# Patient Record
Sex: Female | Born: 1980 | Race: White | Hispanic: Yes | Marital: Married | Smoking: Never smoker
Health system: Southern US, Community
[De-identification: ages and names within clinical notes are randomized; demographics above are authoritative.]

## PROBLEM LIST (undated history)

## (undated) DIAGNOSIS — K219 Gastro-esophageal reflux disease without esophagitis: Secondary | ICD-10-CM

## (undated) DIAGNOSIS — R519 Headache, unspecified: Secondary | ICD-10-CM

## (undated) DIAGNOSIS — R112 Nausea with vomiting, unspecified: Secondary | ICD-10-CM

## (undated) DIAGNOSIS — Z9889 Other specified postprocedural states: Secondary | ICD-10-CM

## (undated) DIAGNOSIS — F32A Depression, unspecified: Secondary | ICD-10-CM

## (undated) DIAGNOSIS — F419 Anxiety disorder, unspecified: Secondary | ICD-10-CM

## (undated) DIAGNOSIS — K8020 Calculus of gallbladder without cholecystitis without obstruction: Secondary | ICD-10-CM

## (undated) DIAGNOSIS — F329 Major depressive disorder, single episode, unspecified: Secondary | ICD-10-CM

## (undated) HISTORY — PX: UTERINE FIBROID SURGERY: SHX826

## (undated) HISTORY — DX: Anxiety disorder, unspecified: F41.9

---
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
## 2000-01-15 ENCOUNTER — Encounter: Admission: RE | Admit: 2000-01-15 | Discharge: 2000-01-15 | Payer: Self-pay | Admitting: Internal Medicine

## 2000-03-21 ENCOUNTER — Ambulatory Visit (HOSPITAL_COMMUNITY): Admission: RE | Admit: 2000-03-21 | Discharge: 2000-03-21 | Payer: Self-pay | Admitting: *Deleted

## 2000-07-18 ENCOUNTER — Inpatient Hospital Stay (HOSPITAL_COMMUNITY): Admission: AD | Admit: 2000-07-18 | Discharge: 2000-07-22 | Payer: Self-pay | Admitting: *Deleted

## 2002-08-10 ENCOUNTER — Encounter: Payer: Self-pay | Admitting: Emergency Medicine

## 2002-08-10 ENCOUNTER — Emergency Department (HOSPITAL_COMMUNITY): Admission: EM | Admit: 2002-08-10 | Discharge: 2002-08-10 | Payer: Self-pay | Admitting: *Deleted

## 2002-08-28 ENCOUNTER — Encounter: Admission: RE | Admit: 2002-08-28 | Discharge: 2002-08-28 | Payer: Self-pay | Admitting: Internal Medicine

## 2002-09-11 ENCOUNTER — Emergency Department (HOSPITAL_COMMUNITY): Admission: EM | Admit: 2002-09-11 | Discharge: 2002-09-11 | Payer: Self-pay | Admitting: Emergency Medicine

## 2002-09-23 ENCOUNTER — Encounter: Admission: RE | Admit: 2002-09-23 | Discharge: 2002-09-23 | Payer: Self-pay | Admitting: Internal Medicine

## 2002-10-14 ENCOUNTER — Encounter: Admission: RE | Admit: 2002-10-14 | Discharge: 2002-10-14 | Payer: Self-pay | Admitting: Internal Medicine

## 2002-12-16 ENCOUNTER — Encounter: Admission: RE | Admit: 2002-12-16 | Discharge: 2002-12-16 | Payer: Self-pay | Admitting: Internal Medicine

## 2003-01-25 ENCOUNTER — Encounter: Payer: Self-pay | Admitting: Internal Medicine

## 2003-01-25 ENCOUNTER — Encounter: Admission: RE | Admit: 2003-01-25 | Discharge: 2003-01-25 | Payer: Self-pay | Admitting: Internal Medicine

## 2003-02-12 ENCOUNTER — Encounter: Admission: RE | Admit: 2003-02-12 | Discharge: 2003-02-12 | Payer: Self-pay | Admitting: Internal Medicine

## 2003-11-26 ENCOUNTER — Encounter: Admission: RE | Admit: 2003-11-26 | Discharge: 2003-11-26 | Payer: Self-pay | Admitting: Internal Medicine

## 2004-03-17 ENCOUNTER — Encounter: Admission: RE | Admit: 2004-03-17 | Discharge: 2004-03-17 | Payer: Self-pay | Admitting: Internal Medicine

## 2004-12-05 ENCOUNTER — Ambulatory Visit: Payer: Self-pay | Admitting: Internal Medicine

## 2004-12-12 ENCOUNTER — Ambulatory Visit: Payer: Self-pay | Admitting: Internal Medicine

## 2004-12-12 ENCOUNTER — Encounter (INDEPENDENT_AMBULATORY_CARE_PROVIDER_SITE_OTHER): Payer: Self-pay | Admitting: Pulmonary Disease

## 2005-07-15 ENCOUNTER — Inpatient Hospital Stay (HOSPITAL_COMMUNITY): Admission: AD | Admit: 2005-07-15 | Discharge: 2005-07-16 | Payer: Self-pay | Admitting: Obstetrics & Gynecology

## 2005-07-18 ENCOUNTER — Inpatient Hospital Stay (HOSPITAL_COMMUNITY): Admission: AD | Admit: 2005-07-18 | Discharge: 2005-07-18 | Payer: Self-pay | Admitting: *Deleted

## 2005-07-20 ENCOUNTER — Inpatient Hospital Stay (HOSPITAL_COMMUNITY): Admission: AD | Admit: 2005-07-20 | Discharge: 2005-07-20 | Payer: Self-pay | Admitting: *Deleted

## 2005-07-20 ENCOUNTER — Ambulatory Visit: Payer: Self-pay | Admitting: *Deleted

## 2005-07-22 ENCOUNTER — Inpatient Hospital Stay (HOSPITAL_COMMUNITY): Admission: AD | Admit: 2005-07-22 | Discharge: 2005-07-22 | Payer: Self-pay | Admitting: Family Medicine

## 2005-08-01 ENCOUNTER — Inpatient Hospital Stay (HOSPITAL_COMMUNITY): Admission: AD | Admit: 2005-08-01 | Discharge: 2005-08-01 | Payer: Self-pay | Admitting: Obstetrics & Gynecology

## 2005-08-21 ENCOUNTER — Ambulatory Visit: Payer: Self-pay | Admitting: Hospitalist

## 2005-11-06 ENCOUNTER — Ambulatory Visit: Payer: Self-pay | Admitting: Hospitalist

## 2005-11-08 ENCOUNTER — Ambulatory Visit (HOSPITAL_COMMUNITY): Admission: RE | Admit: 2005-11-08 | Discharge: 2005-11-08 | Payer: Self-pay | Admitting: Family Medicine

## 2005-11-12 ENCOUNTER — Ambulatory Visit: Payer: Self-pay | Admitting: Internal Medicine

## 2005-12-17 ENCOUNTER — Ambulatory Visit: Payer: Self-pay | Admitting: Family Medicine

## 2006-03-19 ENCOUNTER — Emergency Department (HOSPITAL_COMMUNITY): Admission: EM | Admit: 2006-03-19 | Discharge: 2006-03-19 | Payer: Self-pay | Admitting: Emergency Medicine

## 2006-04-29 ENCOUNTER — Encounter (INDEPENDENT_AMBULATORY_CARE_PROVIDER_SITE_OTHER): Payer: Self-pay | Admitting: *Deleted

## 2006-05-06 ENCOUNTER — Encounter: Payer: Self-pay | Admitting: Family Medicine

## 2006-05-06 ENCOUNTER — Ambulatory Visit: Payer: Self-pay | Admitting: Family Medicine

## 2006-05-07 ENCOUNTER — Ambulatory Visit: Payer: Self-pay | Admitting: Family Medicine

## 2006-05-08 ENCOUNTER — Ambulatory Visit: Payer: Self-pay | Admitting: Family Medicine

## 2006-05-10 ENCOUNTER — Emergency Department (HOSPITAL_COMMUNITY): Admission: EM | Admit: 2006-05-10 | Discharge: 2006-05-10 | Payer: Self-pay | Admitting: Family Medicine

## 2006-06-12 ENCOUNTER — Encounter (INDEPENDENT_AMBULATORY_CARE_PROVIDER_SITE_OTHER): Payer: Self-pay | Admitting: Pulmonary Disease

## 2006-06-12 DIAGNOSIS — F411 Generalized anxiety disorder: Secondary | ICD-10-CM | POA: Insufficient documentation

## 2006-08-26 ENCOUNTER — Encounter: Payer: Self-pay | Admitting: Family Medicine

## 2006-08-26 ENCOUNTER — Ambulatory Visit: Payer: Self-pay | Admitting: Family Medicine

## 2006-08-26 LAB — CONVERTED CEMR LAB
AST: 31 units/L (ref 0–37)
Alkaline Phosphatase: 66 units/L (ref 39–117)
HCV Ab: NEGATIVE
Total Protein: 6.9 g/dL (ref 6.0–8.3)

## 2006-09-27 ENCOUNTER — Encounter (INDEPENDENT_AMBULATORY_CARE_PROVIDER_SITE_OTHER): Payer: Self-pay | Admitting: *Deleted

## 2006-11-02 ENCOUNTER — Inpatient Hospital Stay (HOSPITAL_COMMUNITY): Admission: AD | Admit: 2006-11-02 | Discharge: 2006-11-02 | Payer: Self-pay | Admitting: Obstetrics and Gynecology

## 2006-11-07 ENCOUNTER — Emergency Department (HOSPITAL_COMMUNITY): Admission: EM | Admit: 2006-11-07 | Discharge: 2006-11-07 | Payer: Self-pay | Admitting: Emergency Medicine

## 2006-11-14 ENCOUNTER — Inpatient Hospital Stay (HOSPITAL_COMMUNITY): Admission: AD | Admit: 2006-11-14 | Discharge: 2006-11-14 | Payer: Self-pay | Admitting: Obstetrics and Gynecology

## 2006-11-19 ENCOUNTER — Encounter: Payer: Self-pay | Admitting: *Deleted

## 2006-11-20 ENCOUNTER — Encounter: Payer: Self-pay | Admitting: Family Medicine

## 2006-11-20 ENCOUNTER — Ambulatory Visit: Payer: Self-pay | Admitting: Sports Medicine

## 2006-11-20 ENCOUNTER — Telehealth: Payer: Self-pay | Admitting: *Deleted

## 2006-11-20 LAB — CONVERTED CEMR LAB
Blood in Urine, dipstick: NEGATIVE
Glucose, Urine, Semiquant: NEGATIVE
Protein, U semiquant: 100
WBC Urine, dipstick: NEGATIVE

## 2006-11-22 ENCOUNTER — Telehealth: Payer: Self-pay | Admitting: *Deleted

## 2006-11-23 LAB — CONVERTED CEMR LAB
ALT: 77 units/L — ABNORMAL HIGH (ref 0–35)
AST: 37 units/L (ref 0–37)
Albumin: 4.6 g/dL (ref 3.5–5.2)
Alkaline Phosphatase: 53 units/L (ref 39–117)
BUN: 7 mg/dL (ref 6–23)
Chloride: 102 meq/L (ref 96–112)
Creatinine, Ser: 0.54 mg/dL (ref 0.40–1.20)
Glucose, Bld: 89 mg/dL (ref 70–99)
Potassium: 4.3 meq/L (ref 3.5–5.3)
Sodium: 138 meq/L (ref 135–145)
TSH: 0.097 microintl units/mL — ABNORMAL LOW (ref 0.350–5.50)
Total Protein: 7.5 g/dL (ref 6.0–8.3)

## 2006-11-29 ENCOUNTER — Telehealth: Payer: Self-pay | Admitting: Family Medicine

## 2006-12-02 ENCOUNTER — Telehealth (INDEPENDENT_AMBULATORY_CARE_PROVIDER_SITE_OTHER): Payer: Self-pay | Admitting: *Deleted

## 2006-12-08 ENCOUNTER — Inpatient Hospital Stay (HOSPITAL_COMMUNITY): Admission: AD | Admit: 2006-12-08 | Discharge: 2006-12-08 | Payer: Self-pay | Admitting: Family Medicine

## 2006-12-17 ENCOUNTER — Encounter (INDEPENDENT_AMBULATORY_CARE_PROVIDER_SITE_OTHER): Payer: Self-pay | Admitting: Family Medicine

## 2006-12-17 ENCOUNTER — Ambulatory Visit: Payer: Self-pay | Admitting: Family Medicine

## 2006-12-18 LAB — CONVERTED CEMR LAB
Basophils Absolute: 0 10*3/uL (ref 0.0–0.1)
Basophils Relative: 0 % (ref 0–1)
Eosinophils Absolute: 0.1 10*3/uL (ref 0.0–0.7)
Eosinophils Relative: 1 % (ref 0–5)
Hepatitis B Surface Ag: NEGATIVE
MCHC: 33.1 g/dL (ref 30.0–36.0)
Neutrophils Relative %: 69 % (ref 43–77)
RBC: 4.29 M/uL (ref 3.87–5.11)
RDW: 13.6 % (ref 11.5–14.0)
Rh Type: POSITIVE
Rubella: 6.2 intl units/mL — ABNORMAL HIGH
WBC: 9.1 10*3/uL (ref 4.0–10.5)

## 2006-12-24 ENCOUNTER — Encounter (INDEPENDENT_AMBULATORY_CARE_PROVIDER_SITE_OTHER): Payer: Self-pay | Admitting: Family Medicine

## 2006-12-24 ENCOUNTER — Ambulatory Visit: Payer: Self-pay | Admitting: Sports Medicine

## 2006-12-24 ENCOUNTER — Inpatient Hospital Stay (HOSPITAL_COMMUNITY): Admission: AD | Admit: 2006-12-24 | Discharge: 2006-12-27 | Payer: Self-pay | Admitting: Obstetrics & Gynecology

## 2006-12-24 ENCOUNTER — Ambulatory Visit: Payer: Self-pay | Admitting: Physician Assistant

## 2007-01-09 ENCOUNTER — Ambulatory Visit: Payer: Self-pay | Admitting: Family Medicine

## 2007-01-09 ENCOUNTER — Encounter (INDEPENDENT_AMBULATORY_CARE_PROVIDER_SITE_OTHER): Payer: Self-pay | Admitting: Family Medicine

## 2007-01-13 ENCOUNTER — Inpatient Hospital Stay (HOSPITAL_COMMUNITY): Admission: AD | Admit: 2007-01-13 | Discharge: 2007-01-14 | Payer: Self-pay | Admitting: Family Medicine

## 2007-01-30 ENCOUNTER — Ambulatory Visit (HOSPITAL_COMMUNITY): Admission: RE | Admit: 2007-01-30 | Discharge: 2007-01-30 | Payer: Self-pay | Admitting: Dentistry

## 2007-02-03 ENCOUNTER — Ambulatory Visit: Payer: Self-pay | Admitting: Family Medicine

## 2007-02-06 ENCOUNTER — Encounter (INDEPENDENT_AMBULATORY_CARE_PROVIDER_SITE_OTHER): Payer: Self-pay | Admitting: Family Medicine

## 2007-02-06 ENCOUNTER — Ambulatory Visit: Payer: Self-pay | Admitting: Family Medicine

## 2007-02-06 DIAGNOSIS — Z8632 Personal history of gestational diabetes: Secondary | ICD-10-CM

## 2007-02-06 HISTORY — DX: Personal history of gestational diabetes: Z86.32

## 2007-02-12 ENCOUNTER — Encounter (INDEPENDENT_AMBULATORY_CARE_PROVIDER_SITE_OTHER): Payer: Self-pay | Admitting: Family Medicine

## 2007-02-14 ENCOUNTER — Ambulatory Visit: Payer: Self-pay | Admitting: Family Medicine

## 2007-02-14 ENCOUNTER — Encounter (INDEPENDENT_AMBULATORY_CARE_PROVIDER_SITE_OTHER): Payer: Self-pay | Admitting: Family Medicine

## 2007-02-14 LAB — CONVERTED CEMR LAB
HCV Ab: NEGATIVE
Hepatitis B Surface Ag: NEGATIVE

## 2007-02-17 ENCOUNTER — Encounter: Admission: RE | Admit: 2007-02-17 | Discharge: 2007-02-17 | Payer: Self-pay | Admitting: Obstetrics & Gynecology

## 2007-02-17 ENCOUNTER — Ambulatory Visit: Payer: Self-pay | Admitting: Family Medicine

## 2007-02-19 ENCOUNTER — Telehealth: Payer: Self-pay | Admitting: *Deleted

## 2007-02-24 ENCOUNTER — Ambulatory Visit: Payer: Self-pay | Admitting: Obstetrics & Gynecology

## 2007-02-26 ENCOUNTER — Inpatient Hospital Stay (HOSPITAL_COMMUNITY): Admission: AD | Admit: 2007-02-26 | Discharge: 2007-02-26 | Payer: Self-pay | Admitting: Gynecology

## 2007-02-26 ENCOUNTER — Ambulatory Visit: Payer: Self-pay | Admitting: Obstetrics & Gynecology

## 2007-02-26 ENCOUNTER — Telehealth (INDEPENDENT_AMBULATORY_CARE_PROVIDER_SITE_OTHER): Payer: Self-pay | Admitting: *Deleted

## 2007-02-26 ENCOUNTER — Ambulatory Visit: Payer: Self-pay | Admitting: Gynecology

## 2007-03-03 ENCOUNTER — Ambulatory Visit: Payer: Self-pay | Admitting: Obstetrics & Gynecology

## 2007-03-17 ENCOUNTER — Ambulatory Visit: Payer: Self-pay | Admitting: *Deleted

## 2007-03-24 ENCOUNTER — Ambulatory Visit: Payer: Self-pay | Admitting: Obstetrics & Gynecology

## 2007-03-26 ENCOUNTER — Ambulatory Visit (HOSPITAL_COMMUNITY): Admission: RE | Admit: 2007-03-26 | Discharge: 2007-03-26 | Payer: Self-pay | Admitting: Family Medicine

## 2007-03-26 ENCOUNTER — Encounter (INDEPENDENT_AMBULATORY_CARE_PROVIDER_SITE_OTHER): Payer: Self-pay | Admitting: Family Medicine

## 2007-04-07 ENCOUNTER — Ambulatory Visit: Payer: Self-pay | Admitting: Obstetrics & Gynecology

## 2007-04-21 ENCOUNTER — Ambulatory Visit: Payer: Self-pay | Admitting: Obstetrics & Gynecology

## 2007-04-28 ENCOUNTER — Encounter (INDEPENDENT_AMBULATORY_CARE_PROVIDER_SITE_OTHER): Payer: Self-pay | Admitting: Family Medicine

## 2007-05-05 ENCOUNTER — Ambulatory Visit: Payer: Self-pay | Admitting: Obstetrics & Gynecology

## 2007-05-05 ENCOUNTER — Ambulatory Visit (HOSPITAL_COMMUNITY): Admission: RE | Admit: 2007-05-05 | Discharge: 2007-05-05 | Payer: Self-pay | Admitting: Obstetrics and Gynecology

## 2007-05-05 ENCOUNTER — Encounter (INDEPENDENT_AMBULATORY_CARE_PROVIDER_SITE_OTHER): Payer: Self-pay | Admitting: Family Medicine

## 2007-05-15 ENCOUNTER — Ambulatory Visit: Payer: Self-pay | Admitting: Family Medicine

## 2007-05-19 ENCOUNTER — Ambulatory Visit: Payer: Self-pay | Admitting: Obstetrics & Gynecology

## 2007-05-19 ENCOUNTER — Ambulatory Visit: Payer: Self-pay | Admitting: Family Medicine

## 2007-05-22 ENCOUNTER — Ambulatory Visit: Payer: Self-pay | Admitting: Obstetrics & Gynecology

## 2007-05-23 ENCOUNTER — Inpatient Hospital Stay (HOSPITAL_COMMUNITY): Admission: AD | Admit: 2007-05-23 | Discharge: 2007-05-27 | Payer: Self-pay | Admitting: Obstetrics & Gynecology

## 2007-05-23 ENCOUNTER — Ambulatory Visit: Payer: Self-pay | Admitting: Obstetrics and Gynecology

## 2007-06-04 ENCOUNTER — Telehealth (INDEPENDENT_AMBULATORY_CARE_PROVIDER_SITE_OTHER): Payer: Self-pay | Admitting: Family Medicine

## 2007-07-04 ENCOUNTER — Telehealth: Payer: Self-pay | Admitting: Family Medicine

## 2007-07-14 ENCOUNTER — Ambulatory Visit: Payer: Self-pay | Admitting: Sports Medicine

## 2007-07-14 ENCOUNTER — Telehealth (INDEPENDENT_AMBULATORY_CARE_PROVIDER_SITE_OTHER): Payer: Self-pay | Admitting: *Deleted

## 2007-09-12 ENCOUNTER — Encounter (INDEPENDENT_AMBULATORY_CARE_PROVIDER_SITE_OTHER): Payer: Self-pay | Admitting: Family Medicine

## 2007-09-12 ENCOUNTER — Ambulatory Visit: Payer: Self-pay | Admitting: Family Medicine

## 2007-09-17 LAB — CONVERTED CEMR LAB
ALT: 23 units/L (ref 0–35)
BUN: 10 mg/dL (ref 6–23)
CO2: 22 meq/L (ref 19–32)
Chloride: 108 meq/L (ref 96–112)
Glucose, Bld: 87 mg/dL (ref 70–99)
HCT: 40.6 % (ref 36.0–46.0)
Hemoglobin: 12.6 g/dL (ref 12.0–15.0)
MCHC: 31 g/dL (ref 30.0–36.0)
MCV: 88.6 fL (ref 78.0–100.0)
Platelets: 417 10*3/uL — ABNORMAL HIGH (ref 150–400)
Potassium: 4.5 meq/L (ref 3.5–5.3)
RBC: 4.58 M/uL (ref 3.87–5.11)
RDW: 16.9 % — ABNORMAL HIGH (ref 11.5–15.5)
Sodium: 141 meq/L (ref 135–145)
Total Bilirubin: 0.4 mg/dL (ref 0.3–1.2)
Total Protein: 6.9 g/dL (ref 6.0–8.3)

## 2007-10-14 ENCOUNTER — Encounter (INDEPENDENT_AMBULATORY_CARE_PROVIDER_SITE_OTHER): Payer: Self-pay | Admitting: Family Medicine

## 2007-11-29 ENCOUNTER — Emergency Department (HOSPITAL_COMMUNITY): Admission: EM | Admit: 2007-11-29 | Discharge: 2007-11-29 | Payer: Self-pay | Admitting: Family Medicine

## 2008-03-10 ENCOUNTER — Ambulatory Visit: Payer: Self-pay | Admitting: Family Medicine

## 2008-03-10 DIAGNOSIS — M940 Chondrocostal junction syndrome [Tietze]: Secondary | ICD-10-CM | POA: Insufficient documentation

## 2008-03-18 ENCOUNTER — Emergency Department (HOSPITAL_COMMUNITY): Admission: EM | Admit: 2008-03-18 | Discharge: 2008-03-18 | Payer: Self-pay | Admitting: Emergency Medicine

## 2008-04-01 ENCOUNTER — Encounter (INDEPENDENT_AMBULATORY_CARE_PROVIDER_SITE_OTHER): Payer: Self-pay | Admitting: *Deleted

## 2008-04-01 ENCOUNTER — Ambulatory Visit: Payer: Self-pay | Admitting: Family Medicine

## 2008-04-02 ENCOUNTER — Telehealth (INDEPENDENT_AMBULATORY_CARE_PROVIDER_SITE_OTHER): Payer: Self-pay | Admitting: Family Medicine

## 2008-05-12 IMAGING — US US OB FOLLOW-UP
1 series · 14 of 25 positions shown · non-contrast
Comparison: none

OBSTETRICAL ULTRASOUND:

 This ultrasound exam was performed in the [HOSPITAL] Ultrasound Department.  The OB US report was generated in the AS system, and faxed to the ordering physician.  This report is also available in [REDACTED] PACS.

[Series 1: us ob follow-up · 0.30mm/px · 14 of 25 slices shown]
[im 1/25]
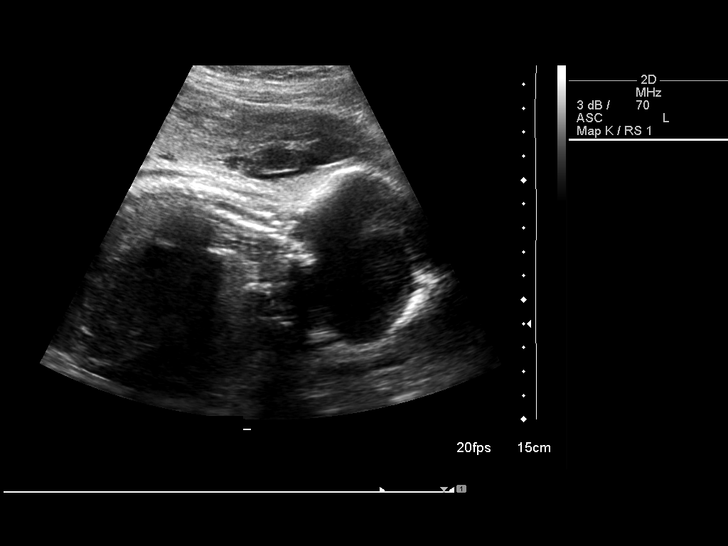
[im 3/25]
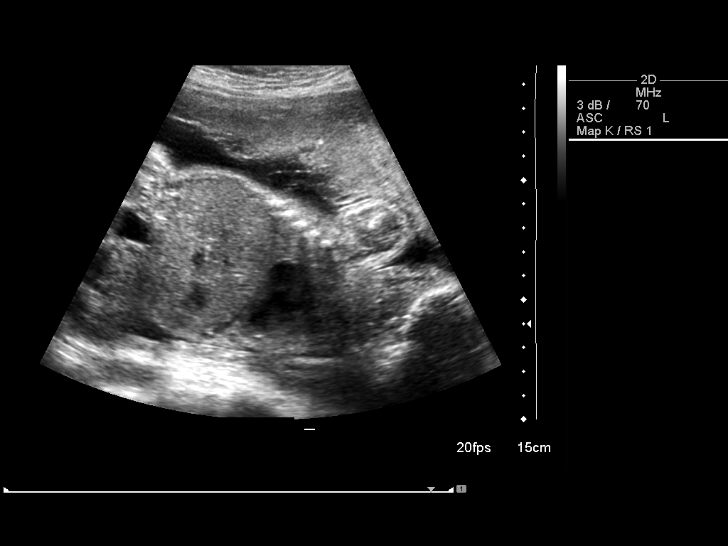
[im 5/25]
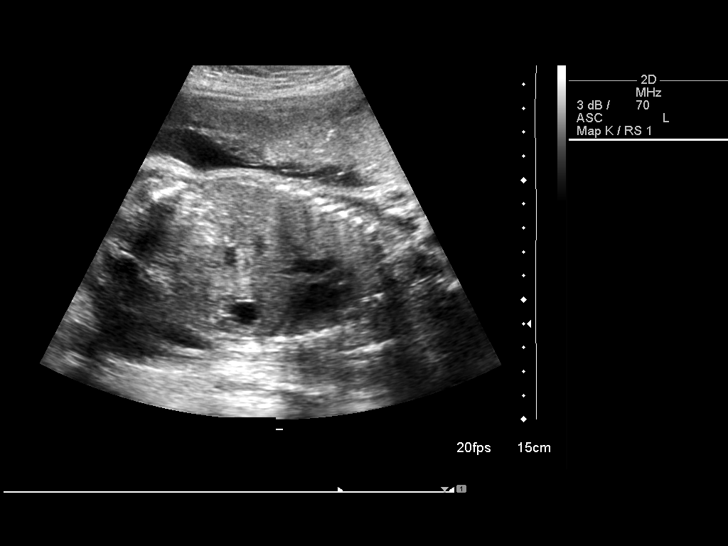
[im 7/25]
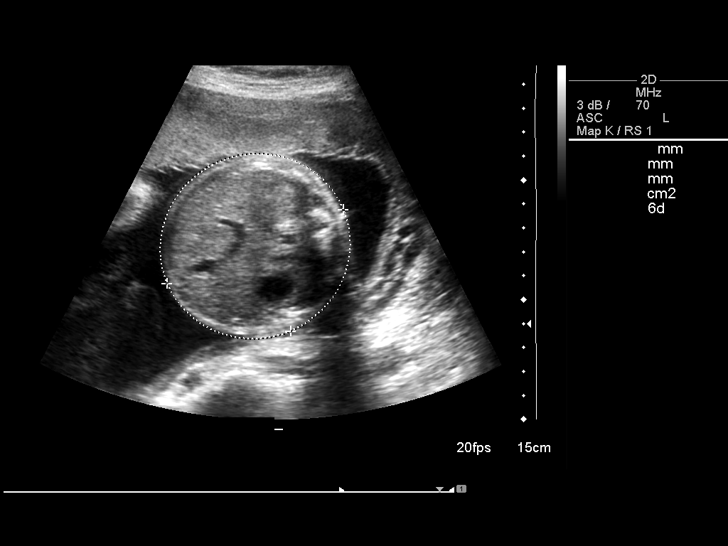
[im 9/25]
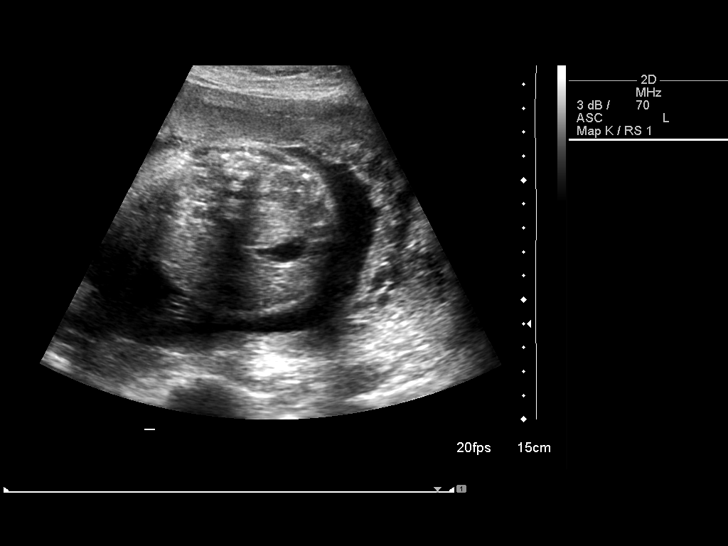
[im 10/25]
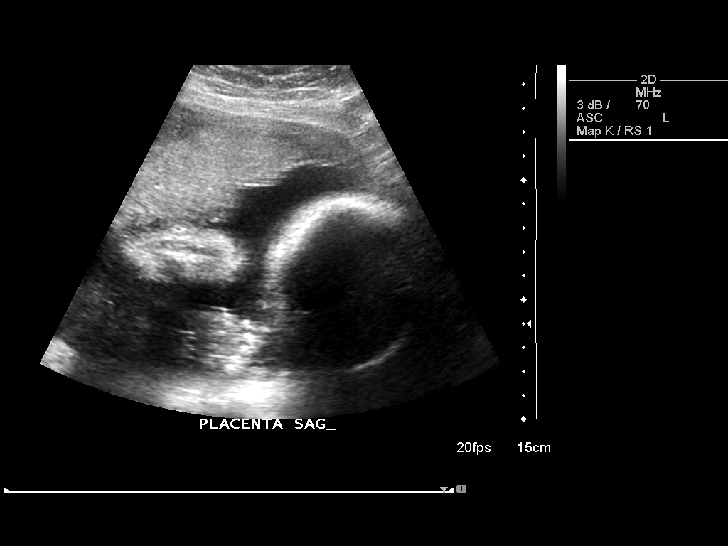
[im 12/25]
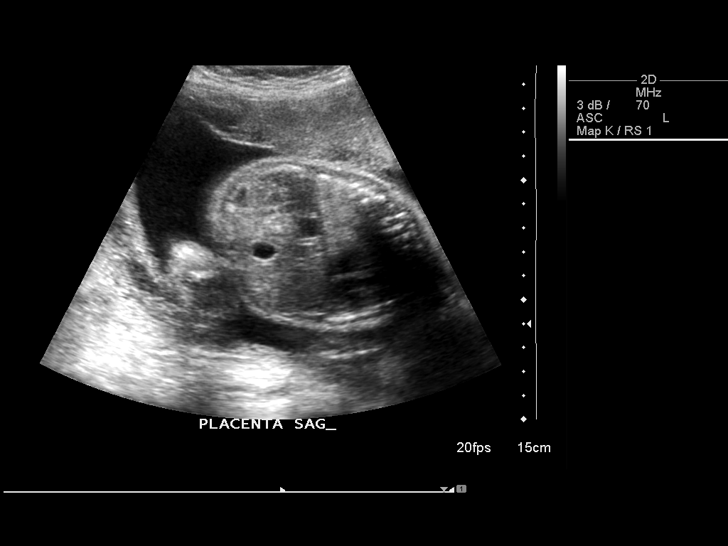
[im 14/25]
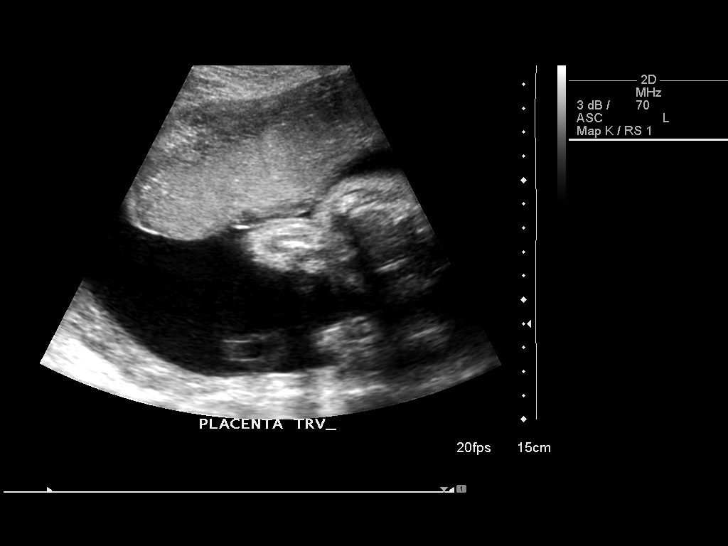
[im 16/25]
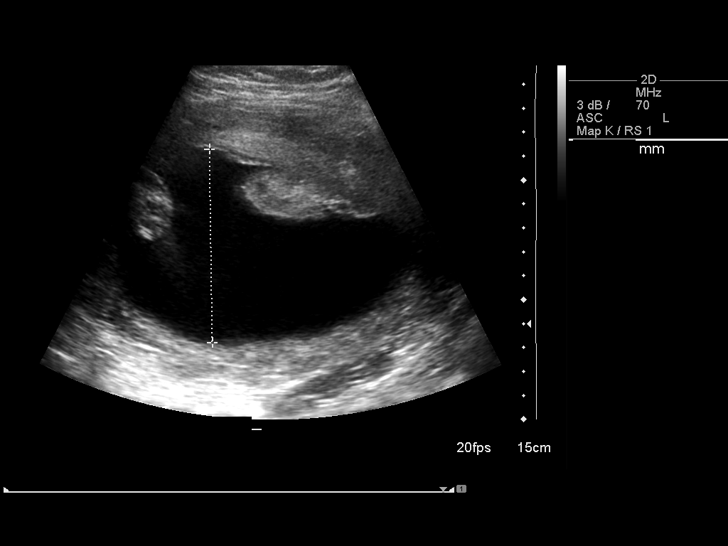
[im 17/25]
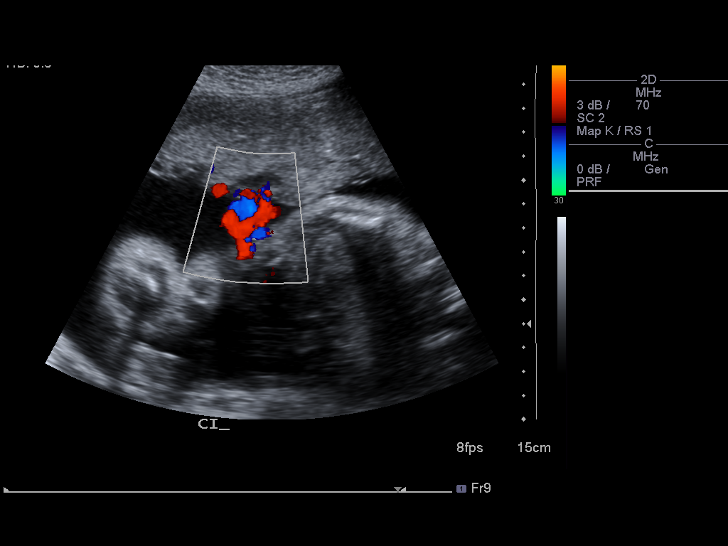
[im 19/25]
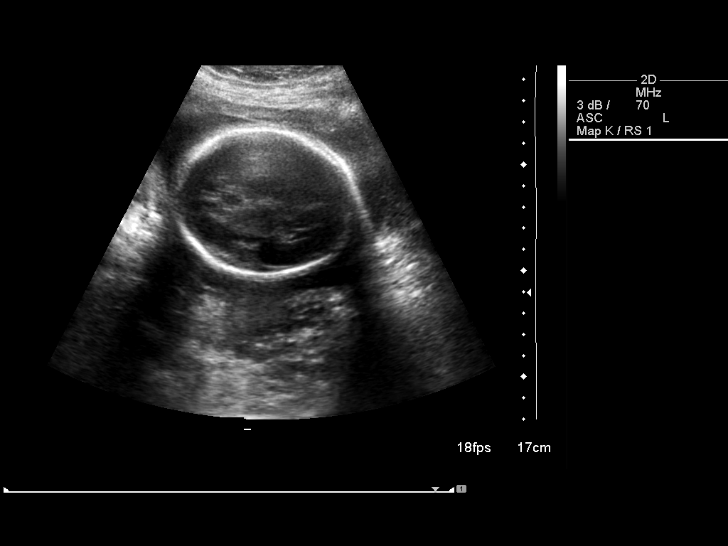
[im 21/25]
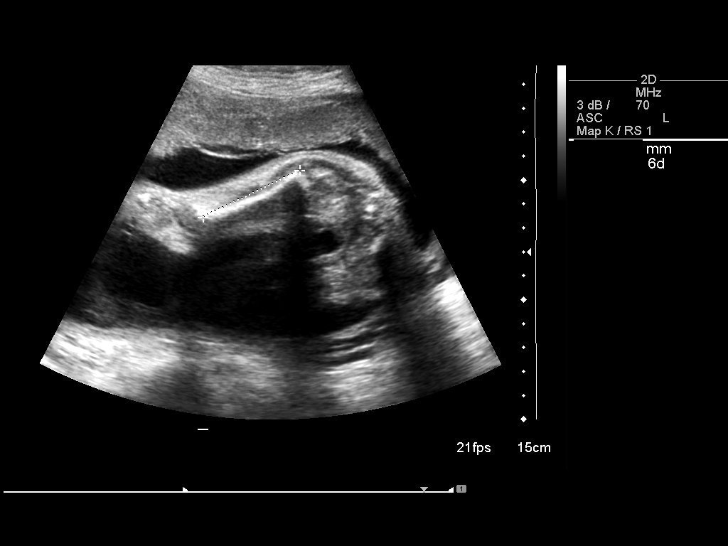
[im 23/25]
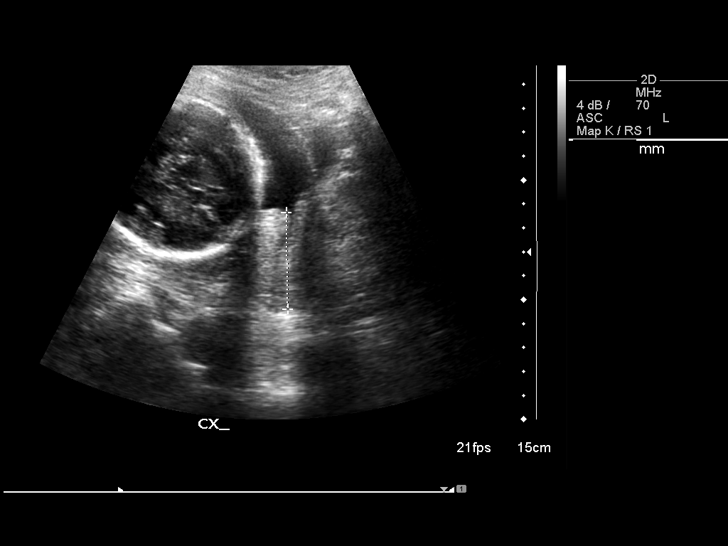
[im 25/25]
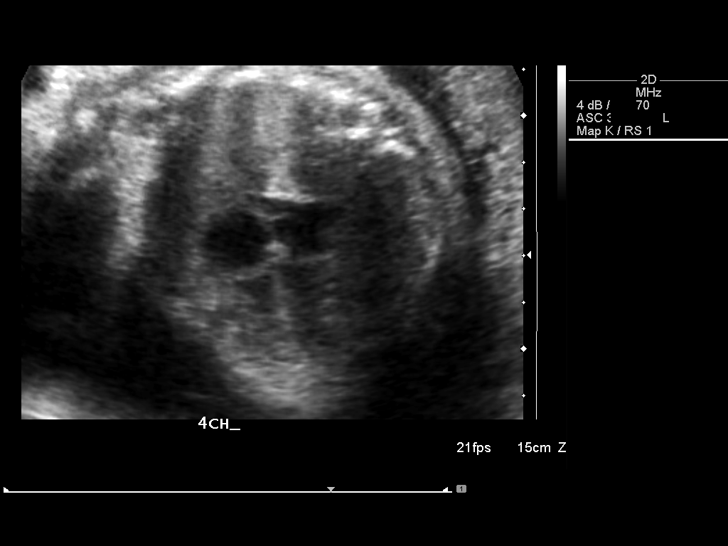

[14 of 25 positions shown; findings below may reference images not displayed]

IMPRESSION: See AS Obstetric US report.

## 2008-07-25 ENCOUNTER — Emergency Department (HOSPITAL_COMMUNITY): Admission: EM | Admit: 2008-07-25 | Discharge: 2008-07-25 | Payer: Self-pay | Admitting: Family Medicine

## 2008-07-29 ENCOUNTER — Emergency Department (HOSPITAL_COMMUNITY): Admission: EM | Admit: 2008-07-29 | Discharge: 2008-07-29 | Payer: Self-pay | Admitting: Emergency Medicine

## 2008-08-02 ENCOUNTER — Ambulatory Visit: Payer: Self-pay | Admitting: Family Medicine

## 2008-08-02 DIAGNOSIS — G510 Bell's palsy: Secondary | ICD-10-CM | POA: Insufficient documentation

## 2008-08-06 ENCOUNTER — Ambulatory Visit: Payer: Self-pay | Admitting: Family Medicine

## 2008-08-20 ENCOUNTER — Telehealth (INDEPENDENT_AMBULATORY_CARE_PROVIDER_SITE_OTHER): Payer: Self-pay | Admitting: Family Medicine

## 2008-08-20 ENCOUNTER — Ambulatory Visit: Payer: Self-pay | Admitting: Family Medicine

## 2008-08-27 ENCOUNTER — Ambulatory Visit: Payer: Self-pay | Admitting: Family Medicine

## 2008-09-07 ENCOUNTER — Ambulatory Visit: Payer: Self-pay | Admitting: Family Medicine

## 2008-09-07 DIAGNOSIS — J069 Acute upper respiratory infection, unspecified: Secondary | ICD-10-CM | POA: Insufficient documentation

## 2008-09-07 DIAGNOSIS — F3289 Other specified depressive episodes: Secondary | ICD-10-CM | POA: Insufficient documentation

## 2008-09-07 DIAGNOSIS — F329 Major depressive disorder, single episode, unspecified: Secondary | ICD-10-CM

## 2008-10-19 ENCOUNTER — Ambulatory Visit: Payer: Self-pay | Admitting: Family Medicine

## 2008-10-19 DIAGNOSIS — G43909 Migraine, unspecified, not intractable, without status migrainosus: Secondary | ICD-10-CM | POA: Insufficient documentation

## 2008-11-23 ENCOUNTER — Ambulatory Visit: Payer: Self-pay | Admitting: Family Medicine

## 2008-11-23 DIAGNOSIS — L708 Other acne: Secondary | ICD-10-CM | POA: Insufficient documentation

## 2008-11-23 DIAGNOSIS — N921 Excessive and frequent menstruation with irregular cycle: Secondary | ICD-10-CM | POA: Insufficient documentation

## 2008-11-23 HISTORY — DX: Excessive and frequent menstruation with irregular cycle: N92.1

## 2008-11-23 LAB — CONVERTED CEMR LAB
BUN: 10 mg/dL (ref 6–23)
Cholesterol: 204 mg/dL — ABNORMAL HIGH (ref 0–200)
FSH: 2.3 milliintl units/mL
Glucose, Bld: 89 mg/dL (ref 70–99)
HDL: 48 mg/dL (ref >39)
LDL Cholesterol: 128 mg/dL — ABNORMAL HIGH (ref 0–99)
Potassium: 3.8 meq/L (ref 3.5–5.3)
Total CHOL/HDL Ratio: 4.3
VLDL: 28 mg/dL (ref 0–40)

## 2008-12-08 ENCOUNTER — Encounter: Payer: Self-pay | Admitting: Family Medicine

## 2008-12-14 ENCOUNTER — Ambulatory Visit: Payer: Self-pay | Admitting: Family Medicine

## 2009-01-14 ENCOUNTER — Ambulatory Visit: Payer: Self-pay | Admitting: Family Medicine

## 2009-01-14 ENCOUNTER — Encounter: Payer: Self-pay | Admitting: Family Medicine

## 2009-01-16 ENCOUNTER — Emergency Department (HOSPITAL_COMMUNITY): Admission: EM | Admit: 2009-01-16 | Discharge: 2009-01-16 | Payer: Self-pay | Admitting: Emergency Medicine

## 2009-10-07 ENCOUNTER — Ambulatory Visit: Payer: Self-pay | Admitting: Family Medicine

## 2009-11-15 ENCOUNTER — Ambulatory Visit: Payer: Self-pay | Admitting: Family Medicine

## 2009-11-15 DIAGNOSIS — E669 Obesity, unspecified: Secondary | ICD-10-CM | POA: Insufficient documentation

## 2009-11-15 LAB — CONVERTED CEMR LAB
ALT: 12 units/L (ref 0–35)
AST: 11 units/L (ref 0–37)
Alkaline Phosphatase: 49 units/L (ref 39–117)
BUN: 11 mg/dL (ref 6–23)
Glucose, Bld: 88 mg/dL (ref 70–99)
HDL: 46 mg/dL (ref >39)
Potassium: 4.2 meq/L (ref 3.5–5.3)
Triglycerides: 125 mg/dL (ref <150)
VLDL: 25 mg/dL (ref 0–40)

## 2009-11-16 ENCOUNTER — Encounter: Payer: Self-pay | Admitting: Family Medicine

## 2010-08-11 ENCOUNTER — Ambulatory Visit: Admission: RE | Admit: 2010-08-11 | Discharge: 2010-08-11 | Payer: Self-pay | Source: Home / Self Care

## 2010-08-20 ENCOUNTER — Encounter: Payer: Self-pay | Admitting: *Deleted

## 2010-08-29 NOTE — Letter (Signed)
Summary: Generic Letter  [facility]  [address]   [place], [place] [address]   Phone: [phone]  Fax: [phone]    11/16/2009  AIZAH GEHLHAUSEN [id number] [place], [place]  [address]  Mariel Sleet,  [place] un gusto verle ayer en la [place].  Escribo para decirle que los laboratorios que chequeamos (para el Chief Financial Officer, Print production planner, y la funcion de los rinones y del higado) salieron normales.   Por favor llame con cualquier pregunta o problema.      Sinceramente,   Paula Compton MD  Appended Document: Generic Letter mailed.

## 2010-08-29 NOTE — Assessment & Plan Note (Signed)
Summary: HA/ad   Vital Signs:  Patient profile:   30 year old female Weight:      143.8 pounds Pulse rate:   68 / minute BP sitting:   106 / 67  (right arm)  Vitals Entered By: Arlyss Repress CMA, (October 07, 2009 4:14 PM) CC: head aches x 2 weeks. Is Patient Diabetic? No Pain Assessment Patient in pain? no        Primary Care Provider:  Melynda Ripple  CC:  head aches x 2 weeks.Marland Kitchen  History of Present Illness: Visit conducted in [place].  Marie Gray comes in today to discuss headache.  She has been seen here for the same problem by me last year, and her HA history has been reviewed with her today.  She has suffered from migraine HAs for many many years; was doing well on twice-daily propanolol but ran out of it.  Was also on Sertraline for depression, and this too was well controlled before she ran out of meds in October 2010.  Did not have any side effects from stopping, but has felt more down lately (she attributes it to the weather).    HAs have worsened in the past 2 to 3 weeks.  Band-like, sometimes unilateral.  No photophobia but does have some nausea without vomiting.  LMP Feb 18th, doubts she could be pregnant because she has not had intercourse lately (husband is travelingwith his work).  Tylenol and Advil help a lot.  Right now her HA is a 5 on a 10 point scale.   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  Started 1 month ago.  Does not drink or smoke.  A little daunting to work and then care for her children at home by herself.  Has family supports here in [place].   Denies nocturnal waking with HA, denies motor deficits or distal sensory disturbances associated, denies visual changes or emesis related to HAs.   Habits & Providers  Alcohol-Tobacco-Diet     Tobacco Status: never  Allergies: No Known Drug Allergies  Physical Exam  General:  well appearing, no apparent distress.  Head:  no  point tenderness. No scalp lesions.   Eyes:  EOMI, PERRL. Clear sclerae.  Difficult to assess fundi wiht undilated funduscopic exam in office today.  Ears:  External ear exam shows no significant lesions or deformities.  Otoscopic examination reveals clear canals, tympanic membranes are intact bilaterally without bulging, retraction, inflammation or discharge. Hearing is grossly normal bilaterally. Nose:  no frontal or maxillary tenderness.  Mouth:  Oral mucosa and oropharynx without lesions or exudates.  Teeth in good repair. Neck:  neck supple witohut adenopathy.  Lungs:  Normal respiratory effort, chest expands symmetrically. Lungs are clear to auscultation, no crackles or wheezes. Heart:  Normal rate and regular rhythm. S1 and S2 normal without gallop, murmur, click, rub or other extra sounds.   Impression & Recommendations:  Problem # 1:  MIGRAINE UNSP W/O INTRACT W/O STATUS MIGRAINOSUS (ICD-346.90)  Her updated medPatient with history of migraines; presentation similar to prior migraine presentations.  Was well controllled on beta blocker.  Will resume prior treatment which was effective.  No red flags on history or exam today.   May continue to take Advil or Tylenol for headaches that breakthrough her regimen.   ication list for this problem includes:    Propranolol Hcl 40 Mg Tabs (Propranolol hcl) ..... Sig: take 1  tablet by mouth two times a day  instructions in spanish  Orders: [facility]- Est Level  3 (10272)  Problem # 2:  DEPRESSIVE DISORDER NOT ELSEWHERE CLASSIFIED (ICD-311)  Patient with mild depressive sxs.  She completed a PHQ-9 today in Spanish, her score is 5 (scores 1 pt each for questions #1, 2,3,4, 8).   She expresses interest in resuming the sertraline treatment.  Will start at 25 mg daily, consider escalation if not well controlled. She believes there is a strong seasonal component to her affect.  Follow up in 4 weeks.  The following medications were removed from the  medication list:    Lorazepam 1 Mg Tabs (Lorazepam) ..... One tablet as needed every 6 hours for nerves. (label in spanish) Her updated medication list for this problem includes:    Sertraline Hcl 25 Mg Tabs (Sertraline hcl) ..... ZDG:UYQI 1 tablet by mouth once daily disp #30 instructions in spanish  Orders: [facility]- Est Level  3 (34742)  Complete Medication List: 1)  Sertraline Hcl 25 Mg Tabs (Sertraline hcl) .... VZD:GLOV 1 tablet by mouth once daily disp #30 instructions in spanish 2)  Propranolol Hcl 40 Mg Tabs (Propranolol hcl) .... Sig: take 1 tablet by mouth two times a day  instructions in spanish  Patient Instructions: 1)  Fue un placer verle hoy. Creo que los dolores de [place] se deben a la misma migrana que ha tenido anteriormente. 2)  Recomiendo que vuelva a tomar la propanolol dos veces por dia; la receta se esta' mandando a la Chief Financial Officer Health Department en [employer].  3)  tambien estamos volviendo con la sertralina de 25mg  una vez al dia.  4)   Quiero volver a verle en 4 a 6 semanas, o antes si los dolores no se lo controlan con la Tylenol ni la Advil. 5)  PATIENT FOR FOLLOWUP IN 4 TO 6 WEEKS WITH DR Mauricio Po Prescriptions: SERTRALINE HCL 25 MG TABS (SERTRALINE HCL) FIE:PPIR 1 tablet by mouth once daily DISP #30 Instructions in Spanish  #30 x 11   Entered and Authorized by:   Paula Compton MD   Signed by:   Paula Compton MD on 10/07/2009   Method used:   Printed then faxed to ...       [facility] Department (retail)       [address] [place], [place]  [address]       Ph: [phone]       Fax: [phone]   RxID:   [id number] PROPRANOLOL HCL 40 MG TABS (PROPRANOLOL HCL) SIG: Take 1 tablet by mouth two times a day  Instructions in Spanish  #60 x 11   Entered and Authorized by:   Paula Compton MD   Signed by:   Paula Compton MD on 10/07/2009   Method used:   Faxed to ...       [facility] Department (retail)       [address] [place], [place]  [address]       Ph: [phone]       Fax: [phone]   RxID:   [id number]

## 2010-08-29 NOTE — Assessment & Plan Note (Signed)
Summary: fu/kh   Vital Signs:  Patient profile:   30 year old female Height:      57 inches Weight:      145.4 pounds BMI:     31.58 Temp:     98.9 degrees F oral Pulse rate:   69 / minute BP sitting:   106 / 66  (left arm) Cuff size:   regular  Vitals Entered By: [place] More[place], SMA CC: F/U HA Is Patient Diabetic? No Pain Assessment Patient in pain? no        Primary Care Provider:  Melynda Ripple  CC:  F/U HA.  History of Present Illness: Visit conducted in [place].   Marie Gray is here for follow up of her headaches and depression. She reports that she has not had another headache.  Initially was started on propranolol for migraine prophylaxis, but stopped taking 3 weeks ago " because I didn't have any more headaches".  When she did have the headaches, they were typical for migraines, wiht photophobia, nausea, and they were temporal and occipital.  Resolved with ibuprofen 600mg  a couple of doses.   Continues to take sertraline with no side effects.  She reports that her mood is much morepositive, upbeat, and she feels very well.  Denies any passive death wish or SI.    Last PAp 2 yrs ago, at [facility], was told the result was normal.  Never had an abnormal PAP.   Habits & Providers  Alcohol-Tobacco-Diet     Tobacco Status: never  Current Medications (verified): 1)  Sertraline Hcl 25 Mg Tabs (Sertraline Hcl) .... ZOX:WRUE 1 Tablet By Mouth Once Daily Disp #30 Instructions in Spanish 2)  Propranolol Hcl 40 Mg Tabs (Propranolol Hcl) .... Sig: Take 1 Tablet By Mouth Two Times A Day  Instructions in Spanish  Allergies (verified): No Known Drug Allergies  Family History: Reviewed history from 09/26/2006 and no changes required. father-DM II, No early death or MI before age 20 in her family  Social History: Reviewed history from 09/26/2006 and no changes required. Lives with husband and daughter; Native of [place], in [place] since 1996; Non-smoker, Non-Drinker; Education 9-12  years  Physical Exam  General:  well appearing, alert, appears with bright affect. No apparent distress.  Head:  no tenderness to palpate over temples, occiput.  Eyes:  clear sclerae bilaterally.  Ears:  External ear exam shows no significant lesions or deformities.  Otoscopic examination reveals clear canals, tympanic membranes are intact bilaterally without bulging, retraction, inflammation or discharge. Hearing is grossly normal bilaterally. Nose:  no frontal or maxillary sinus tenderness. Thin nasal watery discharge; nasal mucosa with bluish boggy hue.  Mouth:  moist mucus membranes with clear oropharynx.  Neck:  No deformities, masses, or tenderness noted.Neck supple.    Impression & Recommendations:  Problem # 1:  MIGRAINE UNSP W/O INTRACT W/O STATUS MIGRAINOSUS (ICD-346.90)  Migraines much better with sertraline alone; not taking prophylaxis propranolol in the past 3 weeks.  She may continue without it, continue to use ibuprofen as needed for abortive therapy.   The following medications were removed from the medication list:    Propranolol Hcl 40 Mg Tabs (Propranolol hcl) ..... Sig: take 1 tablet by mouth two times a day  instructions in spanish  Orders: [facility]- Est Level  3 (45409)  Problem # 2:  OBESITY, UNSPECIFIED (ICD-278.00) Obesity, prior GDM history, and several family members with diabetes. She is fasting today.  For fasting labs.    Orders: Lipid-[facility] ([id number]) Comp  Met-[facility] [phone]) [facility]- Est Level  3 (09811)  Problem # 3:  Preventive Health Care (ICD-V70.0) She had a pap smear 2 yrs ago at [address] (health dept), was told then that it was negative. Has never had an abnormal PAP.  For PAP at next visit, in 6 months time.  She agrees to this.   Complete Medication List: 1)  Sertraline Hcl 25 Mg Tabs (Sertraline hcl) .... BJY:NWGN 1 tablet by mouth once daily disp #30 instructions in spanish  Patient Instructions: 1)  Fue un placer verle hoy.  Me  alegro que esta' mejor desde el punto de vista de las migranas.  2)  No hay que tomar la propranolol mas.   3)  Mande' la receta para la sertralina de 25mg  al [employer] en [employer]. 4)  Quiero que vuelva en 6 meses para el Papanicolau. Prescriptions: SERTRALINE HCL 25 MG TABS (SERTRALINE HCL) FAO:ZHYQ 1 tablet by mouth once daily DISP #30 Instructions in Spanish  #30 x 11   Entered and Authorized by:   Paula Compton MD   Signed by:   Paula Compton MD on 11/15/2009   Method used:   Electronically to        [facility] [phone]* (retail)       [address]       [place], [place]  [address]       Ph: [phone]       Fax: [phone]   RxID:   [id number]

## 2010-08-31 NOTE — Assessment & Plan Note (Signed)
Summary: HEADACHES  STARTED 1 WK AGO/RH   Vital Signs:  Patient profile:   30 year old female Height:      57 inches Weight:      141 pounds BMI:     30.62 Temp:     98.5 degrees F oral Pulse rate:   63 / minute BP sitting:   112 / 59  (left arm) Cuff size:   regular  Vitals Entered By: Tessie Fass CMA (August 11, 2010 2:23 PM) CC: headaches x 1 week   Primary Care Provider:  Melynda Ripple  CC:  headaches x 1 week.  History of Present Illness: Visit conducted in [place].  Marie Gray complains of a resurgence of her headaches; generalized, feels like her migraines.  Some nausea occasionally without emesis.  Has had some dizziness ("mareos") episodically since October, associated with brisk position changes (laying to sitting or standing), last mere seconds then resolve.  No presyncope.  No chest pain.  Does get palpitations when she feels anxious.  Feels very anxious triggered by stressors (denies marital discord or problems with her children; does not feel threatened.  Does not go into the details about social stressors).  Never smoker, no alcohol intake.  Continues to take sertraline 25mg  once daily.  Had taken lorazapam 1mg  (usually 1/2 tab, or 0.5mg ) occasionally for anxiety and this helped her.  Requests again today.   LMP Jan 6-10.  Came at regular interval.  Does not think she could be pregnant.   ROS: Has had R ear pain and crampy pain behind her R ear "calambres", no change in hearing. no tinnitus.  Current Medications (verified): 1)  Lorazepam 1 Mg Tabs (Lorazepam) .... Sig: Take 1 Tab By Mouth Every 12 Hours As Needed For Panic Spanish Instructions 2)  Sertraline Hcl 50 Mg Tabs (Sertraline Hcl) .... Sig Take 1 Tab By Mouth One Time Daily Spanish Language Instructions  Allergies (verified): No Known Drug Allergies  Social History: Reviewed history from 09/26/2006 and no changes required. Lives with husband and daughter; Native of [place], in [place] since 1996; Non-smoker, Non-Drinker;  Education 9-12 years  Physical Exam  General:  well appearing, no apparent distress Eyes:  conjunctivae clear.  Ears:  TMs clear bilat. No pain to manipulate pinnae or tragus bilat.  No skin changes or periauricular adenopathy Nose:  no sinus tenderness with palpation Frontal or maxillary.  Mouth:  moist mucus membranes, clear oropharynx Neck:  No deformities, masses, or tenderness noted. no adenopathy, no carotid bruits.  Lungs:  Normal respiratory effort, chest expands symmetrically. Lungs are clear to auscultation, no crackles or wheezes. Heart:  Normal rate and regular rhythm. S1 and S2 normal without gallop, murmur, click, rub or other extra sounds. Neurologic:  Gilberto Better with severe onset of dizziness symptoms when looking L; stopped maneuver due to severity of symptoms.  Nystagmus not observed due to patient reponse. Walks independently without apparent difficulty.   Impression & Recommendations:  Problem # 1:  BENIGN PAROXYSMAL POSITIONAL VERTIGO (ICD-386.11)  BPPV by exam. severe response to [employer].  Instructed on use of Epley's maneuvers at home, handout given from [employer] with additional Spanish instructions written in margins next to diagrams of maneuver.  Patient voices understanding.  Declines my offer to peform full Epley's in office at this time.  Feels safe to drive home today.   Orders: [facility]- Est  Level 4 (99214)  Problem # 2:  DEPRESSIVE DISORDER NOT ELSEWHERE CLASSIFIED (ICD-311)  Appears to be situationally worse; increased  anxiety component. WIll increase dosing of sertraline, discussed that I do not want her to discontinue abruptly.  She agrees.  Short-term use of lorazepam, discussed potential for sedation and for habituation with regular prolonged use.  FOr followup in the coming month or sooner as needed. PHQ9 in Spanish at next visit.  The following medications were removed from the medication list:    Sertraline Hcl 25 Mg Tabs (Sertraline hcl) .....  EAV:WUJW 1 tablet by mouth once daily disp #30 instructions in spanish Her updated medication list for this problem includes:    Lorazepam 1 Mg Tabs (Lorazepam) ..... Sig: take 1 tab by mouth every 12 hours as needed for panic spanish instructions    Sertraline Hcl 50 Mg Tabs (Sertraline hcl) ..... Sig take 1 tab by mouth one time daily spanish language instructions  Orders: [facility]- Est  Level 4 (11914)  Problem # 3:  MIGRAINE UNSP W/O INTRACT W/O STATUS MIGRAINOSUS (ICD-346.90)  Orders:In setting of BPPV andworsening of situational depression. Will increase SSRI and reevaluate.  She declines IM med at this time for analgesia.  Reports trace headache at this time,not warranting med at this time.  [facility]- Est  Level 4 (99214)  Complete Medication List: 1)  Lorazepam 1 Mg Tabs (Lorazepam) .... Sig: take 1 tab by mouth every 12 hours as needed for panic spanish instructions 2)  Sertraline Hcl 50 Mg Tabs (Sertraline hcl) .... Sig take 1 tab by mouth one time daily spanish language instructions  Patient Instructions: 1)  Fue un placer verle hoy.  Creo que el mareo se debe al vertigo.  Le estoy dando una hoja con instrucciones sobre como hacer ejercicios para eliminar la causa de este problema.  2)  Aumentamos la dosis de la sertralina a 50mg , una vez por dia.  Es importante que no la descontinue abruptamente.   3)  Le estoy dando una receta para lorazepam 1mg ; puede tomar de 1/2 a 1 tableta cuando le dan ataques de ansia.  Puede provocar sueno, y Horticulturist, commercial su abilidad para Company secretary o de hacer actividades que requieren de Economist.  4)  Quiero volver a verle en un mes, o antes si es necesario. 5)  FOLLOWUP APPT WITH DR Mauricio Po IN 3 TO 5 WEEKS. Prescriptions: SERTRALINE HCL 50 MG TABS (SERTRALINE HCL) SIG Take 1 tab by mouth one time daily Spanish language instructions  #30 x 6   Entered and Authorized by:   Paula Compton MD   Signed by:   Paula Compton MD on 08/11/2010   Method used:    Electronically to        [facility] [phone]* (retail)       [address]       [place], [place]  [address]       Ph: [phone]       Fax: [phone]   RxID:   [id number] LORAZEPAM 1 MG TABS (LORAZEPAM) SIG: Take 1 tab by mouth every 12 hours as needed for panic Spanish instructions  #30 x 0   Entered and Authorized by:   Paula Compton MD   Signed by:   Paula Compton MD on 08/11/2010   Method used:   Print then Give to Patient   RxID:   [id number]    Orders Added: 1)  [facility]- Est  Level 4 [95188]

## 2010-09-05 ENCOUNTER — Ambulatory Visit (INDEPENDENT_AMBULATORY_CARE_PROVIDER_SITE_OTHER): Payer: Self-pay | Admitting: Family Medicine

## 2010-09-05 ENCOUNTER — Encounter: Payer: Self-pay | Admitting: Family Medicine

## 2010-09-05 DIAGNOSIS — H8110 Benign paroxysmal vertigo, unspecified ear: Secondary | ICD-10-CM

## 2010-09-05 DIAGNOSIS — Z3009 Encounter for other general counseling and advice on contraception: Secondary | ICD-10-CM

## 2010-09-14 NOTE — Assessment & Plan Note (Signed)
Summary: f/u eo   Vital Signs:  Patient profile:   30 year old female Height:      57 inches Weight:      141 pounds BMI:     30.62 Temp:     98.1 degrees F oral Pulse rate:   88 / minute BP sitting:   107 / 62  (left arm) Cuff size:   regular  Vitals Entered By: Tessie Fass CMA (September 05, 2010 9:31 AM) CC: F/U   Primary Care Provider:  Melynda Ripple  CC:  F/U.  History of Present Illness: Patient seen today; visit in Spanish.   She continued to do the Epley's maneuvers for 4 days after her last visit.  Sudden relief after completing the maneuver on the fourth day.  No dizziness since.   Has felt much better emotionally.  Stopped the Sertraline 50mg , takes 25mg  daily now.  Feels well. PHQ9 done today is ZERO. Has not taken the BNZ for anxiety since she picked it up from the pharmacy  Had PAP done at [facility] Dept since last visit; told PAP was normal.  Was put on Yasmin OCPs and feels headache, legs feel strange.  Has had similar HAs with Sprintec in the past.  DepoProvera gave weight gain. Has appt for Mirena at health dept in March.   Current Medications (verified): 1)  Lorazepam 1 Mg Tabs (Lorazepam) .... Sig: Take 1 Tab By Mouth Every 12 Hours As Needed For Panic Spanish Instructions 2)  Sertraline Hcl 50 Mg Tabs (Sertraline Hcl) .... Sig Take 1/2 Tab By Mouth One Time Daily Spanish Language Instructions  Allergies (verified): No Known Drug Allergies  Social History: Lives with husband and daughter; Native of [place], in [place] since 1996; Non-smoker, Non-Drinker; Education 9-12 years  Sep 05, 2010: Married.  Daughter age 65 in 4th grade, excellent student.  Nonsmoker, does not drink.  Christian.  Physical Exam  General:  well appearing, no distress.  Animated affect   Impression & Recommendations:  Problem # 1:  BENIGN PAROXYSMAL POSITIONAL VERTIGO (ICD-386.11)  Resolved.    Orders: [facility]- Est Level  3 (21308)  Problem # 2:  CONTRACEPTIVE COUNSELING NEC  (ICD-V25.09)  Spent most of visit talking about different contraceptive methods.  She has considered OCPs, patch, and Depo, IUD.  Will opt for Mirena.  Has had acquaintences who conceived on the copper IUD and had miscarriage/SAB related to it.  Will make appt in March for Mirena at [facility].  Already qualified for it.  Plans to continue same OCPs she is taking; declines offers to change to Tri Sprintec until Mirena placed.   Orders: [facility]- Est Level  3 ([address]84)  Problem # 3:  DEPRESSIVE DISORDER NOT ELSEWHERE CLASSIFIED (ICD-311) PHQ9 is zero today.  Her updated medication list for this problem includes:    Lorazepam 1 Mg Tabs (Lorazepam) ..... Sig: take 1 tab by mouth every 12 hours as needed for panic spanish instructions    Sertraline Hcl 50 Mg Tabs (Sertraline hcl) ..... Sig take 1/2 tab by mouth one time daily spanish language instructions  Complete Medication List: 1)  Lorazepam 1 Mg Tabs (Lorazepam) .... Sig: take 1 tab by mouth every 12 hours as needed for panic spanish instructions 2)  Sertraline Hcl 50 Mg Tabs (Sertraline hcl) .... Sig take 1/2 tab by mouth one time daily spanish language instructions  Patient Instructions: 1)  Fue un placer verle.  Me alegro que el mareo se le ha quitado.  2)  Marque con el Departamento de Salud para la Mirena.  Si tiene dudas, puede llamar para conversarlo.   Orders Added: 1)  [facility]- Est Level  3 [27253]

## 2010-09-20 ENCOUNTER — Encounter: Payer: Self-pay | Admitting: *Deleted

## 2010-09-28 ENCOUNTER — Emergency Department (HOSPITAL_COMMUNITY): Payer: Self-pay

## 2010-09-28 ENCOUNTER — Emergency Department (HOSPITAL_COMMUNITY)
Admission: EM | Admit: 2010-09-28 | Discharge: 2010-09-29 | Disposition: A | Discharge status: Home / Self Care | Payer: No Typology Code available for payment source | Attending: Emergency Medicine | Admitting: Emergency Medicine

## 2010-09-28 DIAGNOSIS — Y929 Unspecified place or not applicable: Secondary | ICD-10-CM | POA: Insufficient documentation

## 2010-09-28 DIAGNOSIS — F3289 Other specified depressive episodes: Secondary | ICD-10-CM | POA: Insufficient documentation

## 2010-09-28 DIAGNOSIS — S8000XA Contusion of unspecified knee, initial encounter: Secondary | ICD-10-CM | POA: Insufficient documentation

## 2010-09-28 DIAGNOSIS — M25519 Pain in unspecified shoulder: Secondary | ICD-10-CM | POA: Insufficient documentation

## 2010-09-28 DIAGNOSIS — M25569 Pain in unspecified knee: Secondary | ICD-10-CM | POA: Insufficient documentation

## 2010-09-28 DIAGNOSIS — M542 Cervicalgia: Secondary | ICD-10-CM | POA: Insufficient documentation

## 2010-09-28 DIAGNOSIS — Z79899 Other long term (current) drug therapy: Secondary | ICD-10-CM | POA: Insufficient documentation

## 2010-09-28 DIAGNOSIS — S40019A Contusion of unspecified shoulder, initial encounter: Secondary | ICD-10-CM | POA: Insufficient documentation

## 2010-09-28 DIAGNOSIS — M79609 Pain in unspecified limb: Secondary | ICD-10-CM | POA: Insufficient documentation

## 2010-09-28 DIAGNOSIS — F329 Major depressive disorder, single episode, unspecified: Secondary | ICD-10-CM | POA: Insufficient documentation

## 2010-09-28 DIAGNOSIS — R079 Chest pain, unspecified: Secondary | ICD-10-CM | POA: Insufficient documentation

## 2010-10-24 ENCOUNTER — Ambulatory Visit (INDEPENDENT_AMBULATORY_CARE_PROVIDER_SITE_OTHER): Payer: No Typology Code available for payment source | Admitting: Family Medicine

## 2010-10-24 ENCOUNTER — Encounter: Payer: Self-pay | Admitting: Family Medicine

## 2010-10-24 DIAGNOSIS — R1900 Intra-abdominal and pelvic swelling, mass and lump, unspecified site: Secondary | ICD-10-CM

## 2010-10-24 DIAGNOSIS — N943 Premenstrual tension syndrome: Secondary | ICD-10-CM | POA: Insufficient documentation

## 2010-10-24 DIAGNOSIS — N806 Endometriosis in cutaneous scar: Secondary | ICD-10-CM | POA: Insufficient documentation

## 2010-10-24 DIAGNOSIS — R222 Localized swelling, mass and lump, trunk: Secondary | ICD-10-CM

## 2010-10-24 HISTORY — DX: Premenstrual tension syndrome: N94.3

## 2010-10-24 HISTORY — DX: Endometriosis in cutaneous scar: N80.6

## 2010-10-24 MED ORDER — SERTRALINE HCL 50 MG PO TABS: 50.0000 mg | ORAL_TABLET | Freq: Every day | ORAL | Status: DC

## 2010-10-24 NOTE — Patient Instructions (Signed)
Fue un Research officer, trade union.  Creo que la bolita en el vientre es una Training and development officer.  Notifiqueme si aumenta de tamano o si se pone adolorido.  Para el dolor menstrual, tome ibuprofeno 200mg  tabletas, tome 2 a 4 tabletas con algo de comer, cada 6 horas durante el tiempo de [employer].    Vamos a cambiar la dosificacion de la sertralina; mande' una receta para las tabletas de 50mg  a Statistician. Tome 1/2 tableta diario, y en la semana antes de la menstruacion tome una tableta entera (50mg ).

## 2010-10-24 NOTE — Assessment & Plan Note (Signed)
Patient already on sertraline 25mg  daily.  Will have her take 50mg  in the week preceding her menses, 25mg  all other times.  She is concerned about increasing the dose to 50mg  every day, will try this new regimen to see how it works.
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
## 2010-12-12 NOTE — Discharge Summary (Signed)
NAME:  Marie Gray, Marie Gray        ACCOUNT NO.:  [id number]   MEDICAL RECORD NO.:  [id number]          PATIENT TYPE:  INP   LOCATION:  9306                          FACILITY:  [facility]   PHYSICIAN:  Norton Blizzard, M.D.    DATE OF BIRTH:  [birth year]   DATE OF ADMISSION:  12/24/2006  DATE OF DISCHARGE:  12/27/2006                               DISCHARGE SUMMARY   DISCHARGE DIAGNOSES:  1. Hyperemesis gravidarum.  2. Intrauterine pregnancy at about 14 weeks estimated gestation.  3. H. pylori positive.  4. Dehydration.   CONSULTS:  None.   PROCEDURES:  Intermittent Doppler showed a baseline between 145-155.   ADMIT HISTORY AND PHYSICAL:  In brief, this is a 30 year old female G3,  P1011 at approximately 31 weeks' gestation who presented with epigastric  and left flank pain.  She had stated she was having fevers and chills as  well, and nausea and vomiting all day in the 24 hours prior to  admission.  She had gonorrhea and chlamydia cultures which were  negative.  She was initially admitted with a presumptive diagnosis of  pyelonephritis and hyperemesis.   HOSPITAL COURSE:  1. Hyperemesis - there may be a relationship between the patient's      hyperemesis and H. pylori positive, though this is unclear.  She      was made n.p.o. and started on IV fluids with a 500 cc bolus of D5      LR followed by LR at 150 per hour.  She was placed on Zofran and      Reglan as needed for nausea and vomiting.  She was n.p.o. for a      total of 24 hours then slowly advanced to clear liquids up to      discharge and is to continue advancing the diet as tolerated.  She      will be given prescriptions for omeprazole, Zofran, Phenergan and      Reglan on discharge.  2. Dehydration due to #1 above.  The patient had fluid resuscitation      as noted.  Her urine was very concentrated with greater than 80      ketones, 100 protein, specific gravity of greater than 1.03 as a      result of her repeated bouts  of emesis.  She had no emesis in the      24 hours leading up to discharge.  3. Abdominal pain, initially thought to be due to pyelonephritis and      the patient's reported history of fevers, left flank pain; however,      she had only trace leukocyte esterase and urine culture had no      growth and the patient was afebrile throughout her stay.  I feel      that her pain is more likely related to some type of gastric      pathology and may be due to retching with reflux.  We chose not to      treat her H. pylori at this time as peptic ulcer disease is very      uncommon in pregnancy and there  may be some protective effects of      estrogen.  We will treat her with omeprazole 20 mg b.i.d. but chose      not to start amoxicillin and clarithromycin for H. pylori.  This      can be followed up as an outpatient to ensure that her pain has      continued to resolve.  Ceftriaxone had been initially started for      questionable diagnosis of __________  but was also discontinued on      discharge.   DISCHARGE LABS:  Urine culture:  No growth.  Sodium 133, potassium 3.8,  chloride 104, bicarb 19 and glucose 83.  BUN 2, creatinine 0.43.  Bilirubin 0.6, alkaline phosphatase 37, AST 18, ALT 24, protein 5.7,  albumin 2.9, calcium 8.9, white blood cell count 9.6, hemoglobin 11.9,  hematocrit 33.6, platelets 306 with 60% neutrophils.  Free T3 is normal  at 2.9.  Free T4 is very slightly low at 0.88 with 0.89 being the low  limit of normal.  Urinalysis on admission:  Specific gravity greater  than 1.03, moderate bilirubin greater than 80 ketones, 100 protein,  trace leukocyte esterase and nitrite negative; microscopic showed few  squamous, few bacteria and 11-20 white blood cells.   DISCHARGE MEDICATIONS AND INSTRUCTIONS:  1. Reglan 5 mg q.i.d. p.r.n.  2. Phenergan tabs or suppositories 25 mg p.o. or p.r. q.4h. p.r.n.  3. Zofran 4 mg q.6h. p.r.n.  4. Omeprazole 20 mg p.o. b.i.d.   FOLLOW UP:  The  patient is to follow up at [facility] on June 12 at 2:00 p.m. with Dr. Melynda Ripple.   DISCHARGE CONDITION:  Good, stable.   ISSUES FOR FOLLOW UP:  Reassess abdominal pain and at that time maybe  consider treating her H. pylori.           ______________________________  Norton Blizzard, M.D.     SH/MEDQ  D:  12/27/2006  T:  12/27/2006  Job:  [id number]   cc:   Alanda Amass, M.D.

## 2010-12-12 NOTE — Discharge Summary (Signed)
NAME:  Marie Gray, Marie Gray NO.:  [id number]   MEDICAL RECORD NO.:  [id number]          PATIENT TYPE:  WOC   LOCATION:  WOC                          FACILITY:  [facility]   PHYSICIAN:  Norton Blizzard, MD    DATE OF BIRTH:  May 19, 1981   DATE OF ADMISSION:  05/22/2007  DATE OF DISCHARGE:  05/27/2007                               DISCHARGE SUMMARY   HISTORY OF PRESENT ILLNESS:  This 30 year old G 3, now P 1, 2, 1, 3,  presented to [facility] on May 23, 2007.  The  patient was found to be in labor at 34-5/7th weeks.  She had a history  of a previous low transverse C-section.  The patient therefore proceeded  to a C-section for a repeat cesarean.  The patient had a history of  gestational diabetes.   HOSPITAL COURSE:  A repeat low transverse C-section was performed on  May 24, 2007, at 0400 hours.  The operation proceeded well, with no  complications.  A female child weighing 7 pounds, 8.5 ounces was  delivered and transferred to the NICU.  The estimated blood loss was 600  mL.  There were no surgical complications.  Mom was blood type A-  positive.  Group-B strep unknown.  Hepatitis-B surface antigen negative.  RPR negative.  HIV negative.  Rubella equivocal.  The mother is both  breast and bottle feeding.  She will be placed on Micronor for birth  control.   She is being discharged on May 27, 2007, in good condition.  She  will call to schedule a follow-up appointment in six weeks at [facility], where she received her primary care.   Discharge instructions were explained to the patient in Spanish, and she  indicated understanding and agreement.     ______________________________  Donnie Coffin. Samuella Cota, M.D.      Norton Blizzard, MD  Electronically Signed    TBP/MEDQ  D:  05/27/2007  T:  05/27/2007  Job:  [id number]

## 2010-12-12 NOTE — Op Note (Signed)
NAME:  Marie Gray, Marie Gray NO.:  [id number]   MEDICAL RECORD NO.:  [id number]          PATIENT TYPE:  INP   LOCATION:  9309                          [facility]ACILITY[facility]  [facility]   PHYSICIAN:  Allie Bossier, MD        DATE OF BIRTH:  1981/04/14   DATE OF PROCEDURE:  DATE OF DISCHARGE:                               OPERATIVE REPORT   PREOPERATIVE DIAGNOSES:  13 and 1/2 weeks' estimated gestational age, in  labor, previous cesarean.  Declines trial of labor.   POSTOPERATIVE DIAGNOSES:  72 and 1/2 weeks' estimated gestational age,  in labor, previous cesarean.  Declines trial of labor.   PROCEDURE:  Repeat low transverse cesarean section.   SURGEON:  Allie Bossier, MD   ANESTHESIA:  Spinal, Belva Agee, M.D.   COMPLICATIONS:  None.   ESTIMATED BLOOD LOSS:  600 mL.   SPECIMENS:  Cord blood.   FINDINGS:  1. Living female infant with Apgars 7 and 8.  Weight is pending.  2. Anterior placenta intact with three-vessel cord.  3. Normal ovaries.  4. Adhesion of the bladder to the anterior portion of the uterus.   DETAILS OF PROCEDURE AND FINDINGS:  The risks, benefits, and  alternatives were explained, understood, and accepted.  Consents were  signed.   She was taken to the operating room.  Spinal anesthesia was applied  without complication.  She was placed in a dorsal supine position with a  left lateral tilt.  Her abdomen was prepped and draped in the usual  sterile fashion as was her vagina.  A Foley catheter was placed which  drained clear urine throughout the case.   After adequate anesthesia was assured, a transverse incision was made on  the skin at the site of the previous incision.  The incision was carried  down through the subcutaneous tissue to the fascia.  The fascia was  scored in the midline.  Bleeding encountered was cauterized with the  Bovie.  Fascial incision was extended bilaterally and curved slightly  upwards.  The inner one-third of the rectus muscles  were separated in a  transverse fashion using electrosurgical technique.  Excellent  hemostasis was maintained.   Peritoneum was entered with hemostats.  The peritoneal incision was  extended bilaterally, taking care to avoid the bladder.  There was  adhesion of the bladder to the right aspect of the uterus.  This was  taken down with blunt and sharp dissection, taking care to avoid the  bladder.  Bladder blade was placed.  A transverse incision was made on  the moderately well-developed lower uterine segment.  The anterior  placenta was noted.  The uterine incision was extended bilaterally with  bandage scissors.  It curved slightly upwards.  The amniotomy was  performed with hemostats.  Clear fluid was noted.  The baby was easily  delivered from a vertex presentation.  Its mouth and nostrils were  suctioned prior to delivery of the shoulders.  The cord was clamped and  cut, and the baby was transferred to the pediatricians for further care.  Apgars were listed above, and weight is pending.  Cord blood sample was  obtained.   The placenta was extracted and noted to be intact.  The uterus was left  in situ.  The uterine interior was cleaned with a dry lap sponge.  A  bladder blade was placed.  The uterine incision was closed with 2 layers  of 0 chromic in a running locking suture, the second layer imbricating  the first.  There was bleeding at the site where the adhesions had  previously been.  The bleeding was contained with 0 chromic figure-of-  eight sutures.  Excellent hemostasis was noted.  The ovaries were  visualized by tilting the uterus.  They were normal as well.  The  uterine incision was again inspected and noted to be hemostatic as were  the rectus fascia and rectus muscle.  The rectus fascia was then closed  with 0 Vicryl running nonlocking suture.  No defects were palpable.  The  subcutaneous tissue was irrigated, cleaned, and dried.  It was then  infiltrated with 20  more mL of 0.5% Marcaine.  Please note that at the  beginning of the case, 10 mL of Marcaine was injected at the  subcutaneous tissue at the site of the incision.  A subcuticular closure  was done with 3-0 Vicryl suture.  Steri-Strips were placed.  She was  taken to the recovery room in stable condition with the instruments,  sponge and needle counts correct.      Allie Bossier, MD  Electronically Signed     MCD/MEDQ  D:  05/24/2007  T:  05/25/2007  Job:  [id number]

## 2010-12-12 NOTE — Discharge Summary (Signed)
NAMETAMA, GROSZ NO.:  [id number]   MEDICAL RECORD NO.:  [id number]          PATIENT TYPE:  INP   LOCATION:  9309                          [facility]ACILITY[facility]   PHYSICIAN:  Norton Blizzard, MD    DATE OF BIRTH:  02/08/1981   DATE OF ADMISSION:  05/23/2007  DATE OF DISCHARGE:  05/27/2007                               DISCHARGE SUMMARY   The patient is a 30 year old Hispanic female who underwent cesarean  section on May 24, 2007, at 0400 hours for labor at 34 and 5/7  weeks.  Cesarean section was indicated due to previous low transverse  cesarean section.   Dictation ended at this point.      Norton Blizzard, MD      Norton Blizzard, MD     UAD/MEDQ  D:  05/27/2007  T:  05/27/2007  Job:  [id number]

## 2010-12-15 NOTE — Discharge Summary (Signed)
[facility]  Patient:    Marie Gray, Marie Gray                 MRN: [id number] Adm. Date:  [id number] Disc. Date: [id number] Attending:  Antionette Char Dictator:   Gillian Shields, M.D.                           Discharge Summary  DISCHARGE DIAGNOSIS:          Low transverse cesarean section delivery of a single live borne.  DISCHARGE MEDICATIONS:        1. Ibuprofen 600 mg p.o. q.6h. p.r.n. pain.                               2. Tylox 1 p.o. q.6h. p.r.n. pain.                               3. Prenatal vitamin 1 p.o. q.d. x 6 weeks.                               4. Depo-Provera 150 mg IM given at discharge.  HISTORY OF PRESENT ILLNESS:   This 30 year old, G1, P0 at 44 and 4 weeks by 21 week ultrasound presented with spontaneous rupture of membranes of clear fluid and feeling contractions.  PAST MEDICAL HISTORY:         No significant past medical history.  MEDICATIONS:                  Prenatal vitamins.  ALLERGIES:                    No known drug allergies.  ADMISSION PHYSICAL EXAMINATION:  VITAL SIGNS:                  Temperature 98.0, pulse 85, blood pressure 138/80, fetal heart tones 120s to 130s with accelerations, no decelerations and contracting every 1-3 minutes.  HEENT:                        Within normal limits.  CHEST:                        Clear to auscultation bilaterally.  CARDIOVASCULAR:               Regular rate and rhythm.  No murmur.  ABDOMEN:                      Soft, gravid, nontender, normal active bowel sounds.  CERVIX:                       3 cm, 80%, -2 in vertex presentation.  HOSPITAL COURSE:              Marie Gray was admitted to labor and delivery for expectant management.  following 8 hours of active labor, she was dilated to 9 cm, 90% and 0 station.  However, change in station had slowed. An IUPC was inserted to monitor adequacy of contractions and she received low dose Pitocin.   Despite adequate contractions, she failed to dilate any further and on December 21, underwent low transverse primary cesarean section delivering a  viable female infant with Apgars of 8 and 9 at one and five minutes.  Her post partum course was unremarkable.  She received routine postpartum care.  She was breast feeding without difficulty, having minimal lochia, and appropriate incisional tenderness with a firm fundus at the time of discharge.  She received Depo-Provera at discharge.  FOLLOWUP:                     [facility] six weeks post partum. DD:  09/09/00 TD:  09/10/00 Job: [id number]

## 2010-12-15 NOTE — Op Note (Signed)
[facility]  Patient:    Marie Gray, Marie Gray                 MRN: [id number] Proc. Date: 07/19/00 Adm. Date:  [id number] Attending:  Antionette Char Dictator:   Jamey Reas, M.D.                           Operative Report  DATE OF BIRTH:                1981-05-14  PREOPERATIVE DIAGNOSES:       A 30 year old G1, P0 41 and 4, arrest of dilatation.  POSTOPERATIVE DIAGNOSES:      Arrest of dilatation, persistent OP positioning.  PROCEDURE:                    Primary low transverse cesarean section via Pfannenstiel.  SURGEON:                      Roseanna Rainbow, M.D.  ASSISTANT:                    Jamey Reas, M.D.  ANESTHESIA:                   Spinal.  COMPLICATIONS:                None.  ESTIMATED BLOOD LOSS:         500 cc.  FLUIDS:                       Lactated Ringers 2600 cc.  URINE OUTPUT:                 150 cc clear at the end of the procedure.  INDICATIONS:                  A 30 year old G1, P0 at 30 and 4 came in in active labor.  Progressed to 9 cm maximum dilatation.  Had documented adequate labor by IUPC.  FINDINGS:                     Female infant in cephalic presentation, persistent OP.  Clear fluid at the time of delivery.  Pediatrics present at delivery.  Apgars 8 at one minute, 9 at one minute.  Weight 7 pounds 8 ounces. Normal uterus, tubes, and ovaries.  PROCEDURE:                    Patient was taken to the operating room where spinal anesthesia was introduced and found to be adequate.  She was then prepared and draped in a normal sterile fashion in the dorsal supine position with a leftward tilt.  Pfannenstiel skin incision was then made with the scalpel and carried through to the underlying layer of fascia.  The fascia was incised in the midline, incision extended laterally with Mayo scissors. Superior aspect of the fascial incision was grasped with Kocher clamps, elevated, and  the underlying rectus muscles dissected off bluntly.  Attention was then turned to the inferior aspect of this incision which in a similar fashion was grasped, tented up with Kocher clamps, and the rectus muscles dissected off bluntly.  The rectus muscles were then separated in the midline, the peritoneum identified, tented up, and entered sharply with the Metzenbaum scissors.  The peritoneal incision was then  extended superiorly and inferiorly with good visualization of the bladder.  The bladder blade was then entered and the vesicouterine peritoneum identified, grasped with pickups, and entered sharply with Metzenbaum scissors.  This incision was then extended laterally and the bladder flap created digitally.  The bladder blade was reinserted and the lower uterine segment incised in a transverse fashion with the scalpel. The uterine incision was then extended laterally with the bandage scissors. The bladder blade was removed and the infants head delivered atraumatically. The nose and mouth were suctioned with bulb suction and the cord clamped and cut.  The infant was handed to the awaiting pediatricians.  Cord blood was sent.  The placenta was then removed manually, the uterus exteriorized and cleared of all clots and debris.  The uterine incision was repaired with 1-0 Monocryl suture in a running locked fashion.  The uterus was returned to the abdomen.  The gutters were cleared of all clots.  The muscles were reapproximated with scrap 1-0 Monocryl suture.  The fascia was reapproximated with 0 Vicryl in a running fashion.  Skin was closed with staples.  The patient tolerated the procedure well.  Sponge, lap, and needle counts were correct x 2.  Cefotetan 1 g was given at cord clamp.  Patient was taken to the recovery room in stable condition. DD:  07/19/00 TD:  07/19/00 Job: [id number]

## 2011-05-03 LAB — GLUCOSE, CAPILLARY: Glucose-Capillary: 99 mg/dL (ref 70–99)

## 2011-05-09 LAB — URINALYSIS, ROUTINE W REFLEX MICROSCOPIC
Bilirubin Urine: NEGATIVE
Glucose, UA: NEGATIVE
Hgb urine dipstick: NEGATIVE
Ketones, ur: NEGATIVE
Nitrite: NEGATIVE
Specific Gravity, Urine: 1.015
pH: 6

## 2011-05-09 LAB — CBC
HCT: 26.1 — ABNORMAL LOW
HCT: 30.8 — ABNORMAL LOW
Hemoglobin: 10.4 — ABNORMAL LOW
MCHC: 33.4
MCHC: 33.9
MCV: 81.8
MCV: 82.4
Platelets: 322
RBC: 3.74 — ABNORMAL LOW
WBC: 10.4

## 2011-05-09 LAB — POCT URINALYSIS DIP (DEVICE)
Bilirubin Urine: NEGATIVE
Hgb urine dipstick: NEGATIVE
Protein, ur: 30 — AB
pH: 7

## 2011-05-10 LAB — POCT URINALYSIS DIP (DEVICE)
Ketones, ur: NEGATIVE
Protein, ur: NEGATIVE
Urobilinogen, UA: 0.2
pH: 7

## 2011-05-11 LAB — POCT URINALYSIS DIP (DEVICE)
Ketones, ur: NEGATIVE
Protein, ur: NEGATIVE
Specific Gravity, Urine: 1.015
pH: 7

## 2011-05-14 LAB — POCT URINALYSIS DIP (DEVICE)
Ketones, ur: >=160 — AB
Ketones, ur: 40 — AB
Ketones, ur: 40 — AB
Protein, ur: 30 — AB
Protein, ur: 30 — AB
Specific Gravity, Urine: 1.02
Specific Gravity, Urine: 1.015
Specific Gravity, Urine: 1.015
pH: 6.5
pH: 7
pH: 7.5

## 2011-05-14 LAB — URINALYSIS, ROUTINE W REFLEX MICROSCOPIC
Bilirubin Urine: NEGATIVE
Glucose, UA: NEGATIVE
Ketones, ur: NEGATIVE
pH: 7

## 2011-05-16 LAB — GC/CHLAMYDIA PROBE AMP, GENITAL: Chlamydia, DNA Probe: NEGATIVE

## 2011-05-16 LAB — URINALYSIS, ROUTINE W REFLEX MICROSCOPIC
Bilirubin Urine: NEGATIVE
Nitrite: NEGATIVE
Specific Gravity, Urine: 1.015
pH: 6.5

## 2011-05-16 LAB — CBC
Hemoglobin: 12.2
MCHC: 34.1
RBC: 3.93
WBC: 11.5 — ABNORMAL HIGH

## 2011-05-16 LAB — WET PREP, GENITAL
Clue Cells Wet Prep HPF POC: NONE SEEN
Trich, Wet Prep: NONE SEEN
Yeast Wet Prep HPF POC: NONE SEEN

## 2011-06-06 ENCOUNTER — Ambulatory Visit (INDEPENDENT_AMBULATORY_CARE_PROVIDER_SITE_OTHER): Payer: No Typology Code available for payment source | Admitting: Family Medicine

## 2011-06-06 ENCOUNTER — Encounter: Payer: Self-pay | Admitting: Family Medicine

## 2011-06-06 DIAGNOSIS — F329 Major depressive disorder, single episode, unspecified: Secondary | ICD-10-CM

## 2011-06-06 DIAGNOSIS — R5381 Other malaise: Secondary | ICD-10-CM | POA: Insufficient documentation

## 2011-06-06 DIAGNOSIS — O99810 Abnormal glucose complicating pregnancy: Secondary | ICD-10-CM

## 2011-06-06 DIAGNOSIS — R5383 Other fatigue: Secondary | ICD-10-CM | POA: Insufficient documentation

## 2011-06-06 LAB — CBC
MCH: 28.9 pg (ref 26.0–34.0)
MCHC: 32.1 g/dL (ref 30.0–36.0)
Platelets: 416 10*3/uL — ABNORMAL HIGH (ref 150–400)
RBC: 4.19 MIL/uL (ref 3.87–5.11)
RDW: 13.9 % (ref 11.5–15.5)

## 2011-06-06 LAB — TSH: TSH: 1.436 u[IU]/mL (ref 0.350–4.500)

## 2011-06-06 MED ORDER — SERTRALINE HCL 50 MG PO TABS: 50.0000 mg | ORAL_TABLET | Freq: Every day | ORAL | Status: DC

## 2011-06-06 MED ORDER — LORAZEPAM 1 MG PO TABS: 1.0000 mg | ORAL_TABLET | Freq: Two times a day (BID) | ORAL | Status: DC | PRN

## 2011-06-06 NOTE — Patient Instructions (Signed)
Fue un Research officer, trade [employer].   Para los episodios de palpitos, quiero que tome la zoloft 50mg  todos los dias.  Use la Ativan una tableta cada 6 horas solamente cuando le da un episodio.   Estoy haciendo laboratorios [employer], Chief Executive Officer llamo con los [place].  Quiero verle dentro de un mes.    FOLLOW UP WITH DR Mauricio Po IN THE NEXT 3 TO 5 WEEKS.

## 2011-06-06 NOTE — Assessment & Plan Note (Signed)
To recommend use of Zoloft every day (not just around menses).  Will give limited quantity of Ativan for as-needed use for what sound like panic attacks.

## 2011-06-06 NOTE — Assessment & Plan Note (Signed)
Patient with generalized vague sensation of malaise; no focal symptoms.  Headaches that are similar in character to her prior headaches.  Reassurance given to her that it is unlikely that she has serious illness.  Given her history of GDM in 2008, will check A1C and other labs.

## 2011-06-06 NOTE — Progress Notes (Signed)
  Subjective:    Patient ID: Marie Gray, female    DOB: [birth year], 30 y.o.   MRN: [id number]  HPI Visit conducted in Spanish.  Marie Gray comes in for complaint of recurrence of her frontal headaches; had been doing better until recently.  Began with spell of dizziness that was worse when looking to the right about 1 month ago; that single episode abated spontaneously and never recurred.  Soon thereafter began to have nasal and chest congestion, felt like a bad cold for 1 week. Since then she has felt very tired; episodes of feeling palpitations in chest, feeling of panic.  No chest pain.  Worries that she has something very serious or life-threatening going on (she mentions brain tumor, or "cancer").  THinks about her young daughters (ages 64 and 4), and worries for them if she is ill.   Takes Zoloft for perimenstrual dysphoria 5 days before and after onset of menses every month.  LMP 11/02, accompanied by feeling of panic.    Review of Systems See HPI.  Prior GDM at 2008; both parents with DM.     Objective:   Physical Exam  Well appearing, no apparent distress HEENT Neck supple. Thyroid supple without nodules or masses.  EOMI.  No frontal or maxillary sinus tenderness.  COR S1S2, no extra sounds or murmurs. PULM Clear bilaterally. No rales or wheezes.  ABD Soft, nontender, nondistended.       Assessment & Plan:

## 2011-06-07 ENCOUNTER — Telehealth: Payer: Self-pay | Admitting: Family Medicine

## 2011-06-07 LAB — BASIC METABOLIC PANEL
Calcium: 9.1 mg/dL (ref 8.4–10.5)
Sodium: 138 mEq/L (ref 135–145)

## 2011-06-07 NOTE — Telephone Encounter (Signed)
Call completed in Spanish.  Patient reports she is feeling better already.  Reported her normal Hgb and glucose, TSH, renal function.  She is relieved, plans for follow upi in 1 month.  Marie Gray O

## 2011-06-11 ENCOUNTER — Ambulatory Visit: Payer: No Typology Code available for payment source

## 2011-06-28 ENCOUNTER — Telehealth (HOSPITAL_COMMUNITY): Payer: Self-pay | Admitting: Family Medicine

## 2011-06-28 NOTE — Telephone Encounter (Signed)
Fwd to PCP .Arlyss Repress

## 2011-06-28 NOTE — Telephone Encounter (Signed)
Attempted to call back to patient at phone number in "snapshot", "voice mailbox has not been set up yet".  I wished to ask her about the frequency of Ativan use.  Will await her call back to schedule follow up. Deloyce Walthers O

## 2011-06-28 NOTE — Telephone Encounter (Signed)
Pt stated feel good and will call us to set up a future appt. Marine s

## 2011-07-03 ENCOUNTER — Ambulatory Visit: Payer: No Typology Code available for payment source | Admitting: Family Medicine

## 2013-07-18 ENCOUNTER — Other Ambulatory Visit: Payer: Self-pay

## 2013-07-18 ENCOUNTER — Emergency Department (INDEPENDENT_AMBULATORY_CARE_PROVIDER_SITE_OTHER)
Admission: EM | Admit: 2013-07-18 | Discharge: 2013-07-18 | Disposition: A | Discharge status: Home / Self Care | Payer: Self-pay | Source: Home / Self Care | Attending: Emergency Medicine | Admitting: Emergency Medicine

## 2013-07-18 ENCOUNTER — Encounter (HOSPITAL_COMMUNITY): Payer: Self-pay | Admitting: Emergency Medicine

## 2013-07-18 DIAGNOSIS — K219 Gastro-esophageal reflux disease without esophagitis: Secondary | ICD-10-CM

## 2013-07-18 DIAGNOSIS — K2970 Gastritis, unspecified, without bleeding: Secondary | ICD-10-CM

## 2013-07-18 MED ORDER — SUCRALFATE 1 GM/10ML PO SUSP: 1.0000 g | Freq: Three times a day (TID) | ORAL | Status: DC

## 2013-07-18 MED ORDER — RANITIDINE HCL 150 MG PO CAPS: 150.0000 mg | ORAL_CAPSULE | Freq: Two times a day (BID) | ORAL | Status: DC

## 2013-07-18 NOTE — ED Provider Notes (Signed)
CSN: [id number]     Arrival date & time 07/18/13  1410 History   First MD Initiated Contact with Patient 07/18/13 1506     Chief Complaint  Patient presents with  . Abdominal Pain   (Consider location/radiation/quality/duration/timing/severity/associated sxs/prior Treatment) HPI Comments: No vomiting or diarrhea. No Melena. No fever. Discomfort increases with palpation and when lying supine. Reports decreased appetite since discomfort began. No chest pain, diaphoresis, or dyspnea. No urinary symptoms.   Patient is a 32 y.o. female presenting with abdominal pain. The history is provided by the patient and a relative. The history is limited by a language barrier. Language interpreter used: Son providing translation.  Abdominal Pain Pain location:  Epigastric Pain quality: pressure   Pain radiates to:  Back Pain severity:  Mild Onset quality:  Gradual Duration:  2 days Timing:  Constant Progression:  Unchanged Chronicity:  New Context: diet changes   Context comment:  States she has been eating quite a bit of spicy food Relieved by:  Belching Ineffective treatments:  None tried Associated symptoms: nausea   Associated symptoms: no chest pain, no chills, no constipation, no cough, no diarrhea, no fever, no hematemesis, no hematochezia, no hematuria, no melena, no shortness of breath and no sore throat     History reviewed. No pertinent past medical history. History reviewed. No pertinent past surgical history. No family history on file. History  Substance Use Topics  . Smoking status: Never Smoker   . Smokeless tobacco: Not on file  . Alcohol Use: No   OB History   Grav Para Term Preterm Abortions TAB SAB Ect Mult Living                 Review of Systems  Constitutional: Negative for fever and chills.  HENT: Negative for sore throat.   Respiratory: Negative for cough and shortness of breath.   Cardiovascular: Negative for chest pain.  Gastrointestinal: Positive for nausea  and abdominal pain. Negative for diarrhea, constipation, melena, hematochezia and hematemesis.  Genitourinary: Negative for hematuria.  All other systems reviewed and are negative.    Allergies  Review of patient's allergies indicates no known allergies.  Home Medications   Current Outpatient Rx  Name  Route  Sig  Dispense  Refill  . LORazepam (ATIVAN) 1 MG tablet   Oral   Take 1 tablet (1 mg total) by mouth every 12 (twelve) hours as needed. For panic. Spanish instructions   30 tablet   0   . ranitidine (ZANTAC) 150 MG capsule   Oral   Take 1 capsule (150 mg total) by mouth 2 (two) times daily.   60 capsule   1   . sertraline (ZOLOFT) 50 MG tablet   Oral   Take 1 tablet (50 mg total) by mouth daily. Spanish language instructions   30 tablet   11     Spanish language instructions   . sucralfate (CARAFATE) 1 GM/10ML suspension   Oral   Take 10 mLs (1 g total) by mouth 4 (four) times daily -  with meals and at bedtime.   420 mL   0    BP 118/54  Pulse 88  Temp(Src) 98.2 F (36.8 C) (Oral)  Resp 12  SpO2 100%  LMP 07/03/2013 Physical Exam  Nursing note and vitals reviewed. Constitutional: She is oriented to person, place, and time. She appears well-developed and well-nourished. No distress.  HENT:  Head: Normocephalic and atraumatic.  Eyes: No scleral icterus.  Neck: Normal range of  motion. Neck supple. No thyromegaly present.  Cardiovascular: Normal rate, regular rhythm and normal heart sounds.   Pulmonary/Chest: Effort normal and breath sounds normal. No respiratory distress.  Abdominal: Soft. Bowel sounds are normal. She exhibits no distension and no mass. There is no hepatosplenomegaly. There is tenderness in the epigastric area. There is no rebound, no guarding, no CVA tenderness and negative Murphy's sign. No hernia.    Musculoskeletal: Normal range of motion.  Lymphadenopathy:    She has no cervical adenopathy.  Neurological: She is alert and oriented  to person, place, and time.  Skin: Skin is warm and dry.  Psychiatric: She has a normal mood and affect. Her behavior is normal.    ED Course  Procedures (including critical care time) Labs Review Labs Reviewed - No data to display Imaging Review No results found.  EKG Interpretation    Date/Time:    Ventricular Rate:    PR Interval:    QRS Duration:   QT Interval:    QTC Calculation:   R Axis:     Text Interpretation:              MDM   1. GERD (gastroesophageal reflux disease)   2. Gastritis    History and exam suggests gastritis with GERD. No family or personal hx of early heart disease, no known personal risk factors for heart disease and no additional symptoms to suggest new onset or unstable angina. ECG: NSR @ 75 bpm with no ectopy or dynamic ST/T wave changes. Educated regarding GERD diet and prescribed 1-2 weeks of carafate with one month of Zantac. Advised to follow up with PCP if symptoms do not improve.   Jess Barters [place], [place] 07/19/13 [phone]

## 2013-07-18 NOTE — ED Notes (Signed)
C/o 2 day day duration of epigastric pain w radiation to back ; NAD ; at present; denies n/v/d; w/d/color good; pai n"6" on 1-10 scale

## 2013-07-18 NOTE — ED Notes (Signed)
No changes from initial assessment other than pain is alleviated when she Burps Alert w/no signs of acute distress.

## 2013-07-20 NOTE — ED Provider Notes (Signed)
Medical screening examination/treatment/procedure(s) were performed by non-physician practitioner and as supervising physician I was immediately available for consultation/collaboration.  Leslee Home, M.D.  Reuben Likes, MD 07/20/13 [phone]

## 2013-09-01 ENCOUNTER — Inpatient Hospital Stay (HOSPITAL_COMMUNITY): Payer: No Typology Code available for payment source

## 2013-09-01 ENCOUNTER — Encounter (HOSPITAL_COMMUNITY): Payer: Self-pay | Admitting: *Deleted

## 2013-09-01 ENCOUNTER — Inpatient Hospital Stay (HOSPITAL_COMMUNITY)
Admission: AD | Admit: 2013-09-01 | Discharge: 2013-09-01 | Disposition: A | Discharge status: Home / Self Care | Payer: Self-pay | Source: Ambulatory Visit | Attending: Obstetrics & Gynecology | Admitting: Obstetrics & Gynecology

## 2013-09-01 DIAGNOSIS — R109 Unspecified abdominal pain: Secondary | ICD-10-CM | POA: Insufficient documentation

## 2013-09-01 DIAGNOSIS — N806 Endometriosis in cutaneous scar: Secondary | ICD-10-CM | POA: Insufficient documentation

## 2013-09-01 HISTORY — DX: Depression, unspecified: F32.A

## 2013-09-01 HISTORY — DX: Major depressive disorder, single episode, unspecified: F32.9

## 2013-09-01 LAB — URINALYSIS, ROUTINE W REFLEX MICROSCOPIC
BILIRUBIN URINE: NEGATIVE
GLUCOSE, UA: NEGATIVE mg/dL
HGB URINE DIPSTICK: NEGATIVE
KETONES UR: NEGATIVE mg/dL
LEUKOCYTES UA: NEGATIVE
Nitrite: NEGATIVE
PH: 6 (ref 5.0–8.0)
Protein, ur: NEGATIVE mg/dL
Specific Gravity, Urine: 1.02 (ref 1.005–1.030)
Urobilinogen, UA: 0.2 mg/dL (ref 0.0–1.0)

## 2013-09-01 LAB — POCT PREGNANCY, URINE: PREG TEST UR: NEGATIVE

## 2013-09-01 MED ORDER — HYDROCODONE-ACETAMINOPHEN 5-325 MG PO TABS: 1.0000 | ORAL_TABLET | Freq: Four times a day (QID) | ORAL | Status: DC | PRN

## 2013-09-01 NOTE — Discharge Instructions (Signed)
Instrucciones para el uso de analgsicos  (Pain Medicine Instructions) Le han prescrito medicamentos para el dolor o para otros fines. Estos medicamentos pueden afectar la capacidad de razonamiento. Tambin pueden afectar la capacidad para realizar actividades fsicas. Tome estos medicamentos slo si los necesita para aliviar el dolor. No debe tomarlos si no tiene dolor, a menos que el profesional que lo asiste se lo indique. Puede tomar menos de la dosis prescripta si considera que necesita slo una pequea cantidad de medicamento para controlar el dolor. Puede ser que no calmen completamente el dolor, pero debe sentirse confortable para moverse, respirar y cuidar de usted mismo. Despus de que el profesional le haya administrado estos medicamentos, mientras los tome y durante las ocho horas posteriores a su suspensin:  No conduzca.  No opere mquinas.  No manipule herramientas elctricas.  No firme documentos legales.  No controle usted mismo a los nios.  No participe en actividades que requieran ascender o hacer actividades en lugares altos.  No se sumerja en el agua (lago, ro, ocano, spa o piscina) sin un adulto a su lado que pueda ayudarlo. Le han prescripto analgsicos que contienen acetaminofeno (paracetamol). En este caso, tome slo la cantidad que le ha indicado el mdico. No tome ningn otro medicamento que contenga acetaminofeno mientras tome este medicamento prescripto. Una sobredosis puede causar un dao grave en el hgado. Si toma otros medicamentos, controle los ingredientes activos para ver si contienen este frmaco. El acetaminofeno se encuentra en gran cantidad de medicamentos tanto de venta libre como prescriptos. Por ejemplo, en los productos para el alivio del resfro, para bajar la fiebre, para calmar la acidez y para el alivio del dolor. INSTRUCCIONES PARA EL CUIDADO DOMICILIARIO  No beba alcohol, no tome pldoras para dormir ni otros medicamentos hasta que no hayan  transcurridos ocho horas desde la suspensin de estos medicamentos, o segn lo que le indique el profesional que lo asiste.  Si estos medicamentos le producen estreimiento, use un laxante. Aumentar el consumo de frutas y verduras tambin lo ayudarn a evitar el estreimiento.  Anote las horas en las que toma los medicamentos. Verifique los horarios antes de tomar el prximo medicamento. Es fcil confundirse mientras se toma medicamentos para el dolor. Si anota los horarios, evitar las sobredosis. SOLICITE ATENCIN MDICA SI:   El medicamento no ayuda a aliviar el dolor.  Vomita o tiene diarrea poco despus de tomar el medicamento.  Siente dolor en una zona que no se haba lastimado. SOLICITE ATENCIN MDICA DE INMEDIATO SI:   Se siente mareado o sufre un desmayo.  Tiene problemas relacionados con el medicamento. ASEGRESE DE QUE:   Comprende estas instrucciones.  Controlar su enfermedad.  Solicitar ayuda de inmediato si no mejora o si empeora. Document Released: 04/25/2005 Document Revised: 10/08/2011 [employer] Patient Information 2014 [employer].  

## 2013-09-01 NOTE — MAU Note (Signed)
Pt states she has pain from a tumor in her abdomen. States she has scar tissue from previous cesarean section in 2008.

## 2013-09-01 NOTE — MAU Provider Note (Signed)
History     CSN: [id number]  Arrival date and time: 09/01/13 1728   First Provider Initiated Contact with Patient 09/01/13 1758      Chief Complaint  Patient presents with  . Abdominal Pain   HPI This is a 33 y.o. female who presents with c/o pain at her C/S incision. She had a C/S in 2008, and then developed a small "tumor' in center of scar, States this has doubled in size over past year. Feels like the skin is splitting open   Pain kept her up last night. No other complaints.   OB History   Grav Para Term Preterm Abortions TAB SAB Ect Mult Living   3 2 2  0 1 0 1 0 0 2      Past Medical History  Diagnosis Date  . Medical history non-contributory     Past Surgical History  Procedure Laterality Date  . Cesarean section      x2    Family History  Problem Relation Age of Onset  . Diabetes Mother   . Hypertension Mother   . Diabetes Father   . Diabetes Maternal Uncle   . Heart disease Paternal Grandfather     History  Substance Use Topics  . Smoking status: Never Smoker   . Smokeless tobacco: Not on file  . Alcohol Use: No    Allergies: No Known Allergies  Prescriptions prior to admission  Medication Sig Dispense Refill  . LORazepam (ATIVAN) 1 MG tablet Take 1 tablet (1 mg total) by mouth every 12 (twelve) hours as needed. For panic. Spanish instructions  30 tablet  0  . ranitidine (ZANTAC) 150 MG capsule Take 1 capsule (150 mg total) by mouth 2 (two) times daily.  60 capsule  1  . sertraline (ZOLOFT) 50 MG tablet Take 1 tablet (50 mg total) by mouth daily. Spanish language instructions  30 tablet  11  . sucralfate (CARAFATE) 1 GM/10ML suspension Take 10 mLs (1 g total) by mouth 4 (four) times daily -  with meals and at bedtime.  420 mL  0    Review of Systems  Constitutional: Negative for fever and chills.  Gastrointestinal: Positive for abdominal pain (over lesion on C/S scar). Negative for nausea, vomiting, diarrhea and constipation.  Genitourinary:  Negative for dysuria.  Neurological: Negative for dizziness and headaches.   Physical Exam   Blood pressure 123/73, pulse 93, temperature 99 F (37.2 C), temperature source Oral, resp. rate 16, height 4\' 9"  (1.448 m), weight 142 lb 9.6 oz (64.683 kg), last menstrual period 08/08/2013.  Physical Exam  Constitutional: She is oriented to person, place, and time. She appears well-developed and well-nourished. No distress.  HENT:  Head: Normocephalic.  Cardiovascular: Normal rate.   Respiratory: Effort normal.  GI: Soft. She exhibits no distension and no mass. There is tenderness. There is no rebound and no guarding.  There is a grape-sized mass in center of C/S scar.  It is midline, about 1-2cm wide and deep. Tender to palpation. Brown in color, looks like a keloid, but extends deeper than I would expect for that  Musculoskeletal: Normal range of motion.  Neurological: She is alert and oriented to person, place, and time.  Skin: Skin is warm and dry.  Psychiatric: She has a normal mood and affect.    MAU Course  Procedures  MDM Dr Macon LargeAnyanwu came to see mass, appears to be endometrioma of scar. US done to assess depth:  [place] Misc Soft Tissue  09/01/2013  CLINICAL DATA:  Mass in the C-section scar.  EXAM: SOFT TISSUE ULTRASOUND - MISCELLANEOUS  TECHNIQUE: Ultrasound was performed of soft tissues at the area of concern.  COMPARISON:  None.  FINDINGS: There is a ill-defined soft tissue abnormality measuring 1.9 x 1.8 x 1.9 cm in the midline extending from just below the skin surface to approximately 1.9 cm in depth.  There is a second area of abnormality in the soft tissues just to the left of the more dominant lesion. This area measures 1.4 x 1.2 x 1.5 cm.    IMPRESSION: Soft tissue masses along the C-section scar both of which most likely represent endometriomas. The masses are immediately adjacent to one another and may be a single mass.     Electronically Signed   By: Geanie Cooley M.D.    On: 09/01/2013 19:12    Assessment and Plan  A;  Endometrioma of Cesarean Scar  P:  Discharge home      Rx Vicodin      Dr Macon Large will move her clinic appt up sooner to arrange surgery  [facility] 09/01/2013, 6:05 PM

## 2013-09-01 NOTE — Progress Notes (Signed)
At c/s scar site

## 2013-09-01 NOTE — MAU Provider Note (Signed)
Attestation of Attending Supervision of Advanced Practitioner (PA/CNM/NP): Evaluation and management procedures were performed by the Advanced Practitioner under my supervision and collaboration.  I have reviewed the Advanced Practitioner's note and chart, and I agree with the management and plan. I examined the lesion, appeared to be a cesarean section scar endometrioma which was confirmed on imaging.  Patient will be seen in clinic soon for a preoperative appointment as she will need surgical management.  Plan discussed with patient with the help of a Spanish interpreter.   Jaynie CollinsUGONNA  Caress Reffitt, MD, FACOG Attending Obstetrician & Gynecologist Faculty Practice, [facility]

## 2013-09-07 ENCOUNTER — Encounter: Payer: Self-pay | Admitting: *Deleted

## 2013-09-14 ENCOUNTER — Encounter: Payer: Self-pay | Admitting: Obstetrics & Gynecology

## 2013-09-14 ENCOUNTER — Ambulatory Visit (INDEPENDENT_AMBULATORY_CARE_PROVIDER_SITE_OTHER): Payer: Self-pay | Admitting: Obstetrics & Gynecology

## 2013-09-14 VITALS — BP 118/75 | HR 103 | Wt 136.3 lb

## 2013-09-14 DIAGNOSIS — N806 Endometriosis in cutaneous scar: Secondary | ICD-10-CM

## 2013-09-14 LAB — POCT URINALYSIS DIP (DEVICE)
BILIRUBIN URINE: NEGATIVE
Glucose, UA: NEGATIVE mg/dL
Hgb urine dipstick: NEGATIVE
Ketones, ur: NEGATIVE mg/dL
LEUKOCYTES UA: NEGATIVE
NITRITE: NEGATIVE
Protein, ur: NEGATIVE mg/dL
Specific Gravity, Urine: 1.02 (ref 1.005–1.030)
Urobilinogen, UA: 0.2 mg/dL (ref 0.0–1.0)
pH: 6.5 (ref 5.0–8.0)

## 2013-09-14 NOTE — Progress Notes (Signed)
   CLINIC ENCOUNTER NOTE  History:  33 y.o. G3P2012 here today for follow up after being seen in MAU on 09/01/13 for a painful cesarean section scar lesion concerning for endometrioma (see scan below).  Patient is Spanish-speaking only, Spanish interpreter present for this encounter.  Patient still reports pain, but this is controlled on oral pain medications. She desire surgical excision of this lesion, worried it may get bigger and cause more pain.  The following portions of the patient's history were reviewed and updated as appropriate: allergies, current medications, past family history, past medical history, past social history, past surgical history and problem list.  Review of Systems:  Pertinent items are noted in HPI.  Objective:  Physical Exam BP 118/75  Pulse 103  Wt 136 lb 4.8 oz (61.825 kg)  LMP 09/08/2013 Gen: NAD Abd: Soft, nontender and nondistended; just point moderate tenderness over her cesarean scar lesion. There is a purple, 2 cm mass in center of C/S scar, tender to palpation.  Pelvic:Deferred  Labs and Imaging 09/01/2013   SOFT TISSUE ULTRASOUND - MISCELLANEOUS  CLINICAL DATA:  Mass in the C-section scar. TECHNIQUE: Ultrasound was performed of soft tissues at the area of concern.  COMPARISON:  None.  FINDINGS: There is a ill-defined soft tissue abnormality measuring 1.9 x 1.8 x 1.9 cm in the midline extending from just below the skin surface to approximately 1.9 cm in depth.  There is a second area of abnormality in the soft tissues just to the left of the more dominant lesion. This area measures 1.4 x 1.2 x 1.5 cm.  IMPRESSION: Soft tissue masses along the C-section scar both of which most likely represent endometriomas. The masses are immediately adjacent to one another and may be a single mass.   Electronically Signed   By: Jim  Maxwell M.D.   On: 09/01/2013 19:12   Assessment & Plan:  Patient desires surgical management with excision of cesarean scar lesion.  The risks  of surgery were discussed in detail with the patient including but not limited to: bleeding; infection which may require antibiotic therapy; injury to surrounding tissue; need for additional procedures; incisional problems and other postoperative or anesthesia complications.  Patient was told that the likelihood that her condition and symptoms will be treated effectively with this surgical management was very high; the postoperative expectations were also discussed in detail. The patient also understands the alternative treatment options which were discussed in full.  All questions were answered.  She was told that she will be contacted by our surgical scheduler regarding the time and date of her surgery; routine preoperative instructions of having nothing to eat or drink after midnight on the day prior to surgery and also coming to the hospital 1.5 hours prior to her time of surgery were also emphasized.  Pain precautions reviewed.   UGONNA  ANYANWU, MD, FACOG Attending Obstetrician & Gynecologist Faculty Practice, [facility] 

## 2013-09-14 NOTE — Patient Instructions (Signed)
Regrese a la clinica cuando tenga su cita. Si tiene problemas o preguntas, llama a la clinica o vaya a la sala de emergencia al [facility].    

## 2013-09-18 ENCOUNTER — Encounter: Payer: Self-pay | Admitting: Obstetrics & Gynecology

## 2013-09-24 ENCOUNTER — Encounter (HOSPITAL_COMMUNITY)
Admission: RE | Admit: 2013-09-24 | Discharge: 2013-09-24 | Disposition: A | Discharge status: Home / Self Care | Payer: No Typology Code available for payment source | Source: Ambulatory Visit | Attending: Obstetrics & Gynecology | Admitting: Obstetrics & Gynecology

## 2013-09-24 ENCOUNTER — Encounter (HOSPITAL_COMMUNITY): Payer: Self-pay

## 2013-09-24 DIAGNOSIS — Z01812 Encounter for preprocedural laboratory examination: Secondary | ICD-10-CM | POA: Insufficient documentation

## 2013-09-24 HISTORY — DX: Gastro-esophageal reflux disease without esophagitis: K21.9

## 2013-09-24 LAB — CBC
HCT: 37 % (ref 36.0–46.0)
HEMOGLOBIN: 12.2 g/dL (ref 12.0–15.0)
MCH: 28 pg (ref 26.0–34.0)
MCHC: 33 g/dL (ref 30.0–36.0)
MCV: 85.1 fL (ref 78.0–100.0)
PLATELETS: 312 10*3/uL (ref 150–400)
RBC: 4.35 MIL/uL (ref 3.87–5.11)
RDW: 14.3 % (ref 11.5–15.5)
WBC: 5.1 10*3/uL (ref 4.0–10.5)

## 2013-09-24 NOTE — Patient Instructions (Signed)
[address] Bernestine AmassLaura G [facility]  09/24/2013   Your procedure is scheduled on:  09/30/13  [facility] through [facility]tr[facility] at 1130 AM.  Pick up the phone at the desk and dial [phone].   Call this number if you have problems the morning of surgery: [phone]   Remember:   Do not eat food:After Midnight.  Do not drink clear liquids: 4 Hours before arrival.  Take these medicines the morning of surgery with A SIP OF WATER: Prilosec   Do not wear jewelry, make-up or nail polish.  Do not wear lotions, powders, or perfumes. You may wear deodorant.  Do not shave 48 hours prior to surgery.  Do not bring valuables to the hospital.  [facility] is not   responsible for any belongings or valuables brought to the hospital.  Contacts, dentures or bridgework may not be worn into surgery.  Leave suitcase in the car. After surgery it may be brought to your room.  For patients admitted to the hospital, checkout time is 11:00 AM the day of              discharge.   Patients discharged the day of surgery will not be allowed to drive             home.  Name and phone number of your driver: NA  Special Instructions:      Please read over the following fact sheets that you were given:   Surgical Site Infection Prevention

## 2013-09-28 ENCOUNTER — Ambulatory Visit: Payer: Self-pay

## 2013-09-29 MED ORDER — DEXTROSE 5 % IV SOLN
2.0000 g | INTRAVENOUS | Status: AC
  Administered 2013-09-30: 2 g via INTRAVENOUS
  Filled 2013-09-29: qty 2

## 2013-09-30 ENCOUNTER — Ambulatory Visit (HOSPITAL_COMMUNITY): Payer: No Typology Code available for payment source | Admitting: Anesthesiology

## 2013-09-30 ENCOUNTER — Encounter (HOSPITAL_COMMUNITY): Payer: No Typology Code available for payment source | Admitting: Anesthesiology

## 2013-09-30 ENCOUNTER — Encounter (HOSPITAL_COMMUNITY)
Admission: RE | Disposition: A | Discharge status: Home / Self Care | Payer: Self-pay | Source: Ambulatory Visit | Attending: Obstetrics & Gynecology

## 2013-09-30 ENCOUNTER — Ambulatory Visit (HOSPITAL_COMMUNITY)
Admission: RE | Admit: 2013-09-30 | Discharge: 2013-09-30 | Disposition: A | Discharge status: Home / Self Care | Payer: No Typology Code available for payment source | Source: Ambulatory Visit | Attending: Obstetrics & Gynecology | Admitting: Obstetrics & Gynecology

## 2013-09-30 ENCOUNTER — Encounter (HOSPITAL_COMMUNITY): Payer: Self-pay | Admitting: *Deleted

## 2013-09-30 DIAGNOSIS — F341 Dysthymic disorder: Secondary | ICD-10-CM | POA: Insufficient documentation

## 2013-09-30 DIAGNOSIS — R1900 Intra-abdominal and pelvic swelling, mass and lump, unspecified site: Secondary | ICD-10-CM

## 2013-09-30 DIAGNOSIS — N806 Endometriosis in cutaneous scar: Secondary | ICD-10-CM | POA: Insufficient documentation

## 2013-09-30 DIAGNOSIS — R222 Localized swelling, mass and lump, trunk: Secondary | ICD-10-CM

## 2013-09-30 DIAGNOSIS — L905 Scar conditions and fibrosis of skin: Secondary | ICD-10-CM | POA: Insufficient documentation

## 2013-09-30 DIAGNOSIS — R51 Headache: Secondary | ICD-10-CM | POA: Insufficient documentation

## 2013-09-30 DIAGNOSIS — K219 Gastro-esophageal reflux disease without esophagitis: Secondary | ICD-10-CM | POA: Insufficient documentation

## 2013-09-30 HISTORY — PX: LIPOMA EXCISION: SHX5283

## 2013-09-30 LAB — PREGNANCY, URINE: Preg Test, Ur: NEGATIVE

## 2013-09-30 SURGERY — EXCISION LIPOMA
Anesthesia: General | Site: Abdomen

## 2013-09-30 MED ORDER — IBUPROFEN 600 MG PO TABS: 600.0000 mg | ORAL_TABLET | Freq: Four times a day (QID) | ORAL | Status: DC | PRN

## 2013-09-30 MED ORDER — KETOROLAC TROMETHAMINE 30 MG/ML IJ SOLN
INTRAMUSCULAR | Status: AC
  Filled 2013-09-30: qty 1

## 2013-09-30 MED ORDER — FENTANYL CITRATE 0.05 MG/ML IJ SOLN
INTRAMUSCULAR | Status: AC
  Administered 2013-09-30: 25 ug via INTRAVENOUS
  Filled 2013-09-30: qty 2

## 2013-09-30 MED ORDER — LIDOCAINE HCL (CARDIAC) 20 MG/ML IV SOLN
INTRAVENOUS | Status: AC
  Filled 2013-09-30: qty 5

## 2013-09-30 MED ORDER — OXYCODONE-ACETAMINOPHEN 5-325 MG PO TABS: 1.0000 | ORAL_TABLET | Freq: Four times a day (QID) | ORAL | Status: DC | PRN

## 2013-09-30 MED ORDER — MIDAZOLAM HCL 2 MG/2ML IJ SOLN
INTRAMUSCULAR | Status: DC | PRN
  Administered 2013-09-30: 2 mg via INTRAVENOUS

## 2013-09-30 MED ORDER — LIDOCAINE HCL (CARDIAC) 20 MG/ML IV SOLN
INTRAVENOUS | Status: DC | PRN
  Administered 2013-09-30: 80 mg via INTRAVENOUS

## 2013-09-30 MED ORDER — DEXAMETHASONE SODIUM PHOSPHATE 10 MG/ML IJ SOLN
INTRAMUSCULAR | Status: DC | PRN
  Administered 2013-09-30: 10 mg via INTRAVENOUS

## 2013-09-30 MED ORDER — ONDANSETRON HCL 4 MG/2ML IJ SOLN
INTRAMUSCULAR | Status: AC
  Filled 2013-09-30: qty 2

## 2013-09-30 MED ORDER — OXYCODONE-ACETAMINOPHEN 5-325 MG PO TABS
ORAL_TABLET | ORAL | Status: AC
  Filled 2013-09-30: qty 2

## 2013-09-30 MED ORDER — MIDAZOLAM HCL 2 MG/2ML IJ SOLN
INTRAMUSCULAR | Status: AC
  Filled 2013-09-30: qty 2

## 2013-09-30 MED ORDER — OXYCODONE-ACETAMINOPHEN 5-325 MG PO TABS
1.0000 | ORAL_TABLET | ORAL | Status: DC | PRN
  Administered 2013-09-30: 2 via ORAL

## 2013-09-30 MED ORDER — DOCUSATE SODIUM 100 MG PO CAPS: 100.0000 mg | ORAL_CAPSULE | Freq: Two times a day (BID) | ORAL | Status: DC | PRN

## 2013-09-30 MED ORDER — LACTATED RINGERS IV SOLN
INTRAVENOUS | Status: DC
  Administered 2013-09-30: 11:00:00 via INTRAVENOUS

## 2013-09-30 MED ORDER — LACTATED RINGERS IV SOLN: INTRAVENOUS | Status: DC

## 2013-09-30 MED ORDER — CEFAZOLIN SODIUM-DEXTROSE 2-3 GM-% IV SOLR: 2.0000 g | INTRAVENOUS | Status: DC

## 2013-09-30 MED ORDER — FENTANYL CITRATE 0.05 MG/ML IJ SOLN
25.0000 ug | INTRAMUSCULAR | Status: DC | PRN
  Administered 2013-09-30: 25 ug via INTRAVENOUS
  Administered 2013-09-30: 50 ug via INTRAVENOUS

## 2013-09-30 MED ORDER — DEXAMETHASONE SODIUM PHOSPHATE 10 MG/ML IJ SOLN
INTRAMUSCULAR | Status: AC
  Filled 2013-09-30: qty 1

## 2013-09-30 MED ORDER — BUPIVACAINE-EPINEPHRINE (PF) 0.5% -1:200000 IJ SOLN
INTRAMUSCULAR | Status: AC
  Filled 2013-09-30: qty 10

## 2013-09-30 MED ORDER — FENTANYL CITRATE 0.05 MG/ML IJ SOLN
INTRAMUSCULAR | Status: AC
  Filled 2013-09-30: qty 2

## 2013-09-30 MED ORDER — LACTATED RINGERS IV SOLN
INTRAVENOUS | Status: DC
  Administered 2013-09-30: 14:00:00 via INTRAVENOUS

## 2013-09-30 MED ORDER — KETOROLAC TROMETHAMINE 30 MG/ML IJ SOLN
15.0000 mg | Freq: Once | INTRAMUSCULAR | Status: AC | PRN
  Administered 2013-09-30: 30 mg via INTRAVENOUS

## 2013-09-30 MED ORDER — FENTANYL CITRATE 0.05 MG/ML IJ SOLN
INTRAMUSCULAR | Status: DC | PRN
  Administered 2013-09-30 (×2): 50 ug via INTRAVENOUS

## 2013-09-30 MED ORDER — PROPOFOL 10 MG/ML IV EMUL
INTRAVENOUS | Status: AC
  Filled 2013-09-30: qty 20

## 2013-09-30 MED ORDER — METOCLOPRAMIDE HCL 5 MG/ML IJ SOLN: 10.0000 mg | Freq: Once | INTRAMUSCULAR | Status: DC | PRN

## 2013-09-30 MED ORDER — MEPERIDINE HCL 25 MG/ML IJ SOLN: 6.2500 mg | INTRAMUSCULAR | Status: DC | PRN

## 2013-09-30 MED ORDER — ONDANSETRON HCL 4 MG/2ML IJ SOLN
INTRAMUSCULAR | Status: DC | PRN
  Administered 2013-09-30: 4 mg via INTRAVENOUS

## 2013-09-30 MED ORDER — PROPOFOL 10 MG/ML IV BOLUS
INTRAVENOUS | Status: DC | PRN
  Administered 2013-09-30: 180 mg via INTRAVENOUS

## 2013-09-30 MED ORDER — BUPIVACAINE-EPINEPHRINE PF 0.5-1:200000 % IJ SOLN
INTRAMUSCULAR | Status: DC | PRN
  Administered 2013-09-30: 30 mL

## 2013-09-30 SURGICAL SUPPLY — 6 items
DRSG OPSITE POSTOP 4X10 (GAUZE/BANDAGES/DRESSINGS) ×2 IMPLANT
SUT MON AB 4-0 PS1 27 (SUTURE) ×2 IMPLANT
SUT PLAIN 2 0 (SUTURE) ×1
SUT PLAIN ABS 2-0 CT1 27XMFL (SUTURE) ×1 IMPLANT
SUT VIC AB 0 CT1 27 (SUTURE) ×2
SUT VIC AB 0 CT1 27XBRD ANBCTR (SUTURE) ×2 IMPLANT

## 2013-09-30 NOTE — Op Note (Signed)
Marie AmassLaura G [facility] PROCEDURE DATE: 09/30/2013  PREOPERATIVE DIAGNOSES: Painful cesarean section scar lesion POSTOPERATIVE DIAGNOSES: The same PROCEDURE: Excision of cesarean section scar lesion SURGEON:  Dr. Jaynie CollinsUgonna Anyanwu ANESTHESIOLOGIST: Dr. Cristela BlueKyle Jackson  INDICATIONS: 33 y.o. [name] here for surgical management of a painful cesarean section scar lesion.  Risks of surgery were discussed with the patient including but not limited to: bleeding which may require transfusion or reoperation; infection which may require antibiotics; injury to bowel, bladder, ureters or other surrounding organs; need for additional procedures; thromboembolic phenomenon, incisional problems and other postoperative/anesthesia complications. Written informed consent was obtained.  Patient is Spanish-speaking only, Spanish interpreter present for the preoperative and postoperative encounters.  FINDINGS:  3 x 4 cm lesion in the subcutaneous lesion of the mid portion of the cesarean section scar.  The lesion involved the surface of her fascia, fascia was not incised and peritoneum was never entered.  ANESTHESIA:  General LMA, local analgesia with 0.5% Marcaine with epinephrine INTRAVENOUS FLUIDS: 750  ml ESTIMATED BLOOD LOSS: 15 ml SPECIMENS: Cesarean section scar lesion COMPLICATIONS: None immediate  PROCEDURE IN DETAIL:  The patient received intravenous antibiotics and had sequential compression devices applied to her lower extremities while in the preoperative area.  She was then taken to the operating room where general anesthesia was administered and was found to be adequate.  She was placed in the dorsal supine position, and was prepped and draped in a sterile manner. After an adequate timeout was performed, attention was turned to her cesarean section scar where local analgesia was administered.  The lesion was picked up using forceps and an elliptical incision was made around the lesion.  Electrocautery was used  to excise the lesion from the surrounding subcutaneous tissue.  Brown fluid was noted to come out of the lesion.  Smaller fragments of tissue were also excised.  The defect was irrigated.  The denuded fascia was reapproximated with 0 Vicryl interrupted stitches. The subcutaneous layer was irrigated, then reapproximated with 2-0 plain gut interrupted stitches, and 30 ml of 0.5% Marcaine with epinephrine was injected subcutaneously around the incision.  The skin was closed with a 4-0 Vicryl subcuticular stitch. The patient tolerated the procedure well. Sponge, lap, instrument and needle counts were correct x 2.  She was taken to the recovery room in stable condition.   The patient will be discharged to home as per PACU criteria.  Routine postoperative instructions given.  She was prescribed Percocet, Ibuprofen and Colace.  She will follow up in the clinic on 10/26/13 for postoperative evaluation.  Jaynie CollinsUGONNA  ANYANWU, MD, FACOG Attending Obstetrician & Gynecologist Faculty Practice, [facility]

## 2013-09-30 NOTE — H&P (View-Only) (Signed)
   CLINIC ENCOUNTER NOTE  History:  33 y.o. [id number] here today for follow up after being seen in MAU on 09/01/13 for a painful cesarean section scar lesion concerning for endometrioma (see scan below).  Patient is Spanish-speaking only, Spanish interpreter present for this encounter.  Patient still reports pain, but this is controlled on oral pain medications. She desire surgical excision of this lesion, worried it may get bigger and cause more pain.  The following portions of the patient's history were reviewed and updated as appropriate: allergies, current medications, past family history, past medical history, past social history, past surgical history and problem list.  Review of Systems:  Pertinent items are noted in HPI.  Objective:  Physical Exam BP 118/75  Pulse 103  Wt 136 lb 4.8 oz (61.825 kg)  LMP 09/08/2013 Gen: NAD Abd: Soft, nontender and nondistended; just point moderate tenderness over her cesarean scar lesion. There is a purple, 2 cm mass in center of C/S scar, tender to palpation.  Pelvic:Deferred  Labs and Imaging 09/01/2013   SOFT TISSUE ULTRASOUND - MISCELLANEOUS  CLINICAL DATA:  Mass in the C-section scar. TECHNIQUE: Ultrasound was performed of soft tissues at the area of concern.  COMPARISON:  None.  FINDINGS: There is a ill-defined soft tissue abnormality measuring 1.9 x 1.8 x 1.9 cm in the midline extending from just below the skin surface to approximately 1.9 cm in depth.  There is a second area of abnormality in the soft tissues just to the left of the more dominant lesion. This area measures 1.4 x 1.2 x 1.5 cm.  IMPRESSION: Soft tissue masses along the C-section scar both of which most likely represent endometriomas. The masses are immediately adjacent to one another and may be a single mass.   Electronically Signed   By: Geanie CooleyJim  Maxwell M.D.   On: 09/01/2013 19:12   Assessment & Plan:  Patient desires surgical management with excision of cesarean scar lesion.  The risks  of surgery were discussed in detail with the patient including but not limited to: bleeding; infection which may require antibiotic therapy; injury to surrounding tissue; need for additional procedures; incisional problems and other postoperative or anesthesia complications.  Patient was told that the likelihood that her condition and symptoms will be treated effectively with this surgical management was very high; the postoperative expectations were also discussed in detail. The patient also understands the alternative treatment options which were discussed in full.  All questions were answered.  She was told that she will be contacted by our surgical scheduler regarding the time and date of her surgery; routine preoperative instructions of having nothing to eat or drink after midnight on the day prior to surgery and also coming to the hospital 1.5 hours prior to her time of surgery were also emphasized.  Pain precautions reviewed.   Jaynie CollinsUGONNA  Endiya Klahr, MD, FACOG Attending Obstetrician & Gynecologist Faculty Practice, [facility]

## 2013-09-30 NOTE — Anesthesia Postprocedure Evaluation (Signed)
  Anesthesia Post-op Note  Patient: Marie Gray  Procedure(s) Performed: Procedure(s): Excision of  Cesarean section scar lesion  (N/A) Patient is awake and responsive. Pain and nausea are reasonably well controlled. Vital signs are stable and clinically acceptable. Oxygen saturation is clinically acceptable. There are no apparent anesthetic complications at this time. Patient is ready for discharge.

## 2013-09-30 NOTE — Anesthesia Preprocedure Evaluation (Addendum)
Anesthesia Evaluation  Patient identified by MRN, date of birth, ID band Patient awake    Reviewed: Allergy & Precautions, H&P , NPO status , Patient's Chart, lab work & pertinent test results, reviewed documented beta blocker date and time   History of Anesthesia Complications Negative for: history of anesthetic complications  Airway Mallampati: I TM Distance: >3 FB Neck ROM: full    Dental  (+) Teeth Intact   Pulmonary neg pulmonary ROS,  breath sounds clear to auscultation  Pulmonary exam normal       Cardiovascular Exercise Tolerance: Good negative cardio ROS  Rhythm:regular Rate:Normal     Neuro/Psych  Headaches, Anxiety Depression    GI/Hepatic Neg liver ROS, GERD-  Medicated,  Endo/Other  negative endocrine ROS  Renal/GU negative Renal ROS  Female GU complaint (c-section scar endometrioma)     Musculoskeletal   Abdominal Normal abdominal exam  (+)   Peds  Hematology negative hematology ROS (+)   Anesthesia Other Findings   Reproductive/Obstetrics negative OB ROS                          Anesthesia Physical Anesthesia Plan  ASA: II  Anesthesia Plan: General LMA   Post-op Pain Management:    Induction:   Airway Management Planned:   Additional Equipment:   Intra-op Plan:   Post-operative Plan:   Informed Consent: I have reviewed the patients History and Physical, chart, labs and discussed the procedure including the risks, benefits and alternatives for the proposed anesthesia with the patient or authorized representative who has indicated his/her understanding and acceptance.   Dental Advisory Given  Plan Discussed with: CRNA and Surgeon  Anesthesia Plan Comments:         Anesthesia Quick Evaluation

## 2013-09-30 NOTE — Preoperative (Signed)
Beta Blockers   Reason not to administer Beta Blockers:Not Applicable 

## 2013-09-30 NOTE — Discharge Instructions (Signed)
Escisin de lesiones en la piel La escisin de una lesin en la piel consiste en extraer un sector de piel mediante pequeos cortes (incisiones) en la piel. Puede hacerse para Buyer, retailmejorar el aspecto esttico (extraccin de un lunar o de un papiloma cutneo). DESPUS DEL PROCEDIMIENTO  La cicatrizacin adecuada depende de diversos factores. La Harley-Davidsonmayora de los pacientes cicatrizan bastante bien con las tcnicas correctas y el autocuidado. Las cicatrices disminuyen con el tiempo. INSTRUCCIONES PARA EL CUIDADO EN EL HOGAR   Tome los analgsicos segn las indicaciones que recibi.  Mantenga el lugar de la incisin limpio, seco y protegido durante al menos 7 dias. Cambie el vendaje tal como se le indic.  Para la hemorragia, presione la herida durante 20minutos de forma suave pero firme con una toalla doblada. Si la hemorragia no se detiene, llame a su mdico.  Evite las actividades y los ejercicios de alto impacto hasta que le quiten los puntos o que el rea cicatrice.  Siga las indicaciones del mdico para reducir al mnimo las cicatrices. Evite la exposicin al sol hasta que el rea se haya cicatrizado. Las cicatrices deben reducirse con el transcurso del Et[place]iempo. Concurra a las consultas de seguimiento con su mdico segn las indicaciones. Obtenga los resultados de la prueba No todos los resultados estarn disponibles durante su visita. En este caso, tenga otra entrevista con su mdico para conocerlos. No suponga que todo est bien si no tiene noticias de su mdico o del establecimiento de salud. Es Copyimportante el seguimiento de todos los Garberresultados de Bean Stationlos anlisis. SOLICITE ATENCIN MDICA SI:   Usted o el nio tiene una temperatura bucal de ms de38,9 C (102 F).  Tiene signos de infeccin (escalofros, Dentistmalestar).  Observa hemorragia, dolor, secrecin, enrojecimiento o hinchazn en el lugar de la incisin.  Observa irregularidades en la piel o cambios en la sensibilidad. ASEGRESE DE QUE:    Comprende estas instrucciones.  Controlar su afeccin.  Recibir ayuda de inmediato si no mejora o si empeora. PARA OBTENER MS INFORMACIN  Deanna ArtisAcademia Americana de Mdicos de AutryvilleFamilia (Teacher, musicAmerican Academy of Charles SchwabFamily Physicians): www.https://powers.com/aafp.org Deanna ArtisAcademia Americana de Dermatologa (American Academy of Dermatology): InfoExam.siwww.aad.org Document Released: 05/06/2013 [facility] Patient Information 2014 [place]ExitCare, [place].

## 2013-09-30 NOTE — Transfer of Care (Signed)
Immediate Anesthesia Transfer of Care Note  Patient: Marie Gray  Procedure(s) Performed: Procedure(s): Excision of  Cesarean section scar lesion  (N/A)  Patient Location: PACU  Anesthesia Type:General  Level of Consciousness: awake, alert  and oriented  Airway & Oxygen Therapy: Patient Spontanous Breathing and Patient connected to nasal cannula oxygen  Post-op Assessment: Report given to PACU RN, Post -op Vital signs reviewed and stable and Patient moving all extremities  Post vital signs: Reviewed and stable  Complications: No apparent anesthesia complications

## 2013-09-30 NOTE — Interval H&P Note (Signed)
History and Physical Interval Note  Marie Gray has presented today for surgery, with the diagnosis of cesarean section scar endometrioma. Patient is Spanish-speaking only, Spanish interpreter present for this encounter.  The various methods of treatment have been discussed with the patient and family.  After consideration of risks, benefits and other options for treatment, the patient has consented to EXCISION OF CESAREAN SECTION SCAR LESION as a surgical intervention.  The patient's history has been reviewed, patient examined, no change in status, stable for surgery.  I have reviewed the patient's chart and labs.  Questions were answered to the patient's satisfaction.  To OR when ready.   Tereso NewcomerUgonna A Jeanpierre Thebeau, MD 09/30/2013 12:27 PM

## 2013-10-01 ENCOUNTER — Encounter (HOSPITAL_COMMUNITY): Payer: Self-pay | Admitting: Obstetrics & Gynecology

## 2013-10-05 ENCOUNTER — Telehealth: Payer: Self-pay | Admitting: *Deleted

## 2013-10-05 ENCOUNTER — Encounter: Payer: Self-pay | Admitting: Obstetrics & Gynecology

## 2013-10-05 NOTE — Telephone Encounter (Signed)
Contacted patient through Interpreter to inform of normal result as thought before surgery.  Pt verbalizes understanding.

## 2013-10-10 ENCOUNTER — Encounter (HOSPITAL_COMMUNITY): Payer: Self-pay | Admitting: *Deleted

## 2013-10-10 ENCOUNTER — Inpatient Hospital Stay (HOSPITAL_COMMUNITY)
Admission: AD | Admit: 2013-10-10 | Discharge: 2013-10-10 | Disposition: A | Discharge status: Home / Self Care | Payer: No Typology Code available for payment source | Source: Ambulatory Visit | Attending: Obstetrics & Gynecology | Admitting: Obstetrics & Gynecology

## 2013-10-10 DIAGNOSIS — O909 Complication of the puerperium, unspecified: Secondary | ICD-10-CM

## 2013-10-10 LAB — URINALYSIS, ROUTINE W REFLEX MICROSCOPIC
BILIRUBIN URINE: NEGATIVE
Glucose, UA: NEGATIVE mg/dL
Hgb urine dipstick: NEGATIVE
Ketones, ur: NEGATIVE mg/dL
Leukocytes, UA: NEGATIVE
Nitrite: NEGATIVE
PROTEIN: NEGATIVE mg/dL
SPECIFIC GRAVITY, URINE: 1.015 (ref 1.005–1.030)
Urobilinogen, UA: 0.2 mg/dL (ref 0.0–1.0)
pH: 8 (ref 5.0–8.0)

## 2013-10-10 LAB — POCT PREGNANCY, URINE: Preg Test, Ur: NEGATIVE

## 2013-10-10 MED ORDER — IBUPROFEN 600 MG PO TABS
600.0000 mg | ORAL_TABLET | Freq: Once | ORAL | Status: AC
  Administered 2013-10-10: 600 mg via ORAL
  Filled 2013-10-10: qty 1

## 2013-10-10 NOTE — MAU Provider Note (Signed)
History     CSN: [id number]  Arrival date and time: 10/10/13 1023   None     Chief Complaint  Patient presents with  . Post-op Problem   HPI  [name] is a 33 y.o. female who presents to MAU with complaints of pain at her c-section incision site, along with an area on the incision site that is hard. The patient was seen in MAU on 09/01/13; the note reports the patient had an endometrioma on her c-section scar. She was taken to the OR on 09/30/13 for excision of the 3.4 cm lesion.  She was sent home with pain medication and has taken it as needed. She last took the medication yesterday; she has not taken anything for pain today. She currently rates her pain 9/10.  Yesterday she noticed small amount of drainage from the site; clear fluid, small odor; none today.   She has a follow up appointment in the clinic on March 30th.   OB History   Grav Para Term Preterm Abortions TAB SAB Ect Mult Living   3 2 1 1 1  0 1 0 0 2      Past Medical History  Diagnosis Date  . Depression   . GERD (gastroesophageal reflux disease)     pyloric stenosis    Past Surgical History  Procedure Laterality Date  . Cesarean section      x2  . Lipoma excision N/A 09/30/2013    Procedure: Excision of  Cesarean section scar lesion ;  Surgeon: Tereso NewcomerUgonna A Anyanwu, MD;  Location: [facility];  Service: Gynecology;  Laterality: N/A;    Family History  Problem Relation Age of Onset  . Diabetes Mother   . Hypertension Mother   . Diabetes Father   . Diabetes Maternal Uncle   . Heart disease Paternal Grandfather     History  Substance Use Topics  . Smoking status: Never Smoker   . Smokeless tobacco: Never Used  . Alcohol Use: No    Allergies: No Known Allergies  Prescriptions prior to admission  Medication Sig Dispense Refill  . docusate sodium (COLACE) 100 MG capsule Take 1 capsule (100 mg total) by mouth 2 (two) times daily as needed.  30 capsule  2  . ibuprofen (ADVIL,MOTRIN) 600 MG  tablet Take 1 tablet (600 mg total) by mouth every 6 (six) hours as needed.  30 tablet  1  . omeprazole (PRILOSEC) 20 MG capsule Take 20 mg by mouth 2 (two) times daily as needed (heartburn).       . sertraline (ZOLOFT) 50 MG tablet Take 50 mg by mouth at bedtime. Spanish language instructions      . oxyCODONE-acetaminophen (PERCOCET/ROXICET) 5-325 MG per tablet Take 1-2 tablets by mouth every 6 (six) hours as needed.  30 tablet  0   Results for orders placed during the hospital encounter of 10/10/13 (from the past 48 hour(s))  URINALYSIS, ROUTINE W REFLEX MICROSCOPIC     Status: None   Collection Time    10/10/13 10:48 AM      Result Value Ref Range   Color, Urine YELLOW  YELLOW   APPearance CLEAR  CLEAR   Specific Gravity, Urine 1.015  1.005 - 1.030   pH 8.0  5.0 - 8.0   Glucose, UA NEGATIVE  NEGATIVE mg/dL   Hgb urine dipstick NEGATIVE  NEGATIVE   Bilirubin Urine NEGATIVE  NEGATIVE   Ketones, ur NEGATIVE  NEGATIVE mg/dL   Protein, ur NEGATIVE  NEGATIVE mg/dL  Urobilinogen, UA 0.2  0.0 - 1.0 mg/dL   Nitrite NEGATIVE  NEGATIVE   Leukocytes, UA NEGATIVE  NEGATIVE   Comment: MICROSCOPIC NOT DONE ON URINES WITH NEGATIVE PROTEIN, BLOOD, LEUKOCYTES, NITRITE, OR GLUCOSE <1000 mg/dL.  POCT PREGNANCY, URINE     Status: None   Collection Time    10/10/13 11:05 AM      Result Value Ref Range   Preg Test, Ur NEGATIVE  NEGATIVE   Comment:            THE SENSITIVITY OF THIS     METHODOLOGY IS >24 mIU/mL    Review of Systems  Constitutional: Negative for fever and chills.  Gastrointestinal: Negative for abdominal pain (Pain at C-section incision site. ).  Skin:       + swelling at C-section incision scar.  + tender area to touch.    Physical Exam   Blood pressure 125/75, pulse 87, temperature 98.2 F (36.8 C), temperature source Oral, resp. rate 16, height 4\' 8"  (1.422 m), weight 60.328 kg (133 lb), last menstrual period 10/03/2013.  Physical Exam  Constitutional: She is oriented to  person, place, and time. She appears well-developed and well-nourished. No distress.  HENT:  Head: Normocephalic.  Eyes: Pupils are equal, round, and reactive to light.  Neck: Neck supple.  Respiratory: Effort normal.  GI: Soft. There is tenderness.    2 cm firm, solid mass at incision; tender to touch. Wide spread ecchymosis around incision.   Musculoskeletal: Normal range of motion.  Neurological: She is alert and oriented to person, place, and time.  Skin: Skin is warm. She is not diaphoretic.  Psychiatric: Her behavior is normal.    MAU Course  Procedures None  MDM Dr. Penne Lash consulted to evaluate patient  Ibuprofen 600 mg PO given in MAU   Assessment and Plan   Assessment:  -Hematoma at surgical site with ecchymosis   Plan:  Discharge home in stable condition Continue percocet at home as needed Ok to take ibuprofen as directed  Keep your follow up appointment with the clinic on March 30; return to MAU sooner with any other concerns   Iona Hansen Dagon Budai, NP  10/10/2013, 6:14 PM

## 2013-10-10 NOTE — Discharge Instructions (Signed)
Hematoma ( Hematoma) Un hematoma es una acumulacin de [place]. La bolsa de sangre se puede convertir en una masa dura y dolorosa bajo la piel. La piel puede estar azul o amarilla si el hematoma se encuentra cerca de la superficie de la piel. La mayora de los hematomas mejoran en unos pocos das o Harve[place] Lakesemanas. Algunos hematomas son graves y requieren Psychologist, prison and probation servicesatencin mdica. Los hematomas pueden ser muy pequeos o muy grandes. CUIDADOS EN EL HOGAR  Aplique hielo sobre la zona lesionada.  Ponga el hielo en una bolsa plstica.  Colquese una toalla entre la piel y la bolsa de hielo.  Deje el hielo durante 20 minutos 2 a 3 veces por da, durante los 1 o 2 primeros Pennsborodas.  Despus de los primeros 2 das, aplique compresas calientes en la zona afectada.  Levante (eleve la zona lesionada para Teacher, early years/predisminuir el dolor y la inflamacin (hinchazn). Tambin puede envolver el rea con un vendaje elstico. Asegrese de que el vendaje no se ajusta demasiado.  Si tiene un hematoma doloroso en la pierna o el pie, puede utilizar muletas durante un par Tarnovde das.  Tome slo los medicamentos segn le haya indicado el mdico. SOLICITE AYUDA DE INMEDIATO SI:   El dolor empeora.  El dolor no se alivia con los United Parcelmedicamentos.  Tiene fiebre.  Su hinchazn empeora.  Su piel se vuelve ms azul o amarilla.  La piel sobre el hematoma se rompe o comienza a Geophysicist/field seismologistsangrar.  El hematoma se encuentra en el trax o el vientre (abdomen) y siente debilidad, le falta el aire o cambia su estado de conciencia.  El hematoma se encuentra en el cuero cabelludo y tiene un dolor de cabeza que empeora o hay un cambio en el estado de alerta o de conciencia. ASEGRESE DE QUE:   Comprende estas instrucciones.  Controlar su afeccin.  Recibir ayuda de inmediato si no mejora o si empeora. Document Released: 07/16/2005 Document Revised: 03/18/2013 [facility]ExitCare Patient Information 2014 [place]ExitCare, [place]LLC.

## 2013-10-10 NOTE — MAU Note (Signed)
Patient presents with complaint of discomfort and drainage from her surgical scar (excision of lesion on cesarean scar on 3/4).

## 2013-10-19 ENCOUNTER — Emergency Department (HOSPITAL_COMMUNITY)
Admission: EM | Admit: 2013-10-19 | Discharge: 2013-10-19 | Disposition: A | Discharge status: Home / Self Care | Payer: No Typology Code available for payment source | Source: Home / Self Care | Attending: Family Medicine | Admitting: Family Medicine

## 2013-10-19 ENCOUNTER — Encounter (HOSPITAL_COMMUNITY): Payer: Self-pay | Admitting: Emergency Medicine

## 2013-10-19 DIAGNOSIS — R197 Diarrhea, unspecified: Secondary | ICD-10-CM

## 2013-10-19 DIAGNOSIS — T3695XA Adverse effect of unspecified systemic antibiotic, initial encounter: Secondary | ICD-10-CM

## 2013-10-19 DIAGNOSIS — K521 Toxic gastroenteritis and colitis: Secondary | ICD-10-CM

## 2013-10-19 DIAGNOSIS — K2990 Gastroduodenitis, unspecified, without bleeding: Secondary | ICD-10-CM

## 2013-10-19 DIAGNOSIS — K2970 Gastritis, unspecified, without bleeding: Secondary | ICD-10-CM

## 2013-10-19 LAB — POCT URINALYSIS DIP (DEVICE)
Bilirubin Urine: NEGATIVE
Glucose, UA: NEGATIVE mg/dL
Ketones, ur: NEGATIVE mg/dL
LEUKOCYTES UA: NEGATIVE
NITRITE: NEGATIVE
PH: 6.5 (ref 5.0–8.0)
PROTEIN: NEGATIVE mg/dL
Specific Gravity, Urine: 1.01 (ref 1.005–1.030)
Urobilinogen, UA: 0.2 mg/dL (ref 0.0–1.0)

## 2013-10-19 LAB — POCT PREGNANCY, URINE: Preg Test, Ur: NEGATIVE

## 2013-10-19 MED ORDER — METRONIDAZOLE 500 MG PO TABS: 500.0000 mg | ORAL_TABLET | Freq: Three times a day (TID) | ORAL | Status: DC

## 2013-10-19 MED ORDER — SUCRALFATE 1 GM/10ML PO SUSP: 1.0000 g | Freq: Three times a day (TID) | ORAL | Status: DC

## 2013-10-19 MED ORDER — RANITIDINE HCL 150 MG PO CAPS: 150.0000 mg | ORAL_CAPSULE | Freq: Two times a day (BID) | ORAL | Status: DC

## 2013-10-19 NOTE — ED Provider Notes (Signed)
CSN: [id number]     Arrival date & time 10/19/13  1744 History   First MD Initiated Contact with Patient 10/19/13 2000     No chief complaint on file.  (Consider location/radiation/quality/duration/timing/severity/associated sxs/prior Treatment) HPI Comments: 33 year old female presents for evaluation of stomach pain. She also admits to diarrhea. She has had a stomach pain for one month now. She was found to have H. pylori gastritis and was started on H. pylori regimen that consisted of amoxicillin, clarithromycin, omeprazole. Since that time, her abdominal pain has not gotten any better. On day 8 of the antibiotics, she started to get diarrhea that has persisted to today. It is frequent and watery, no blood. She has lost 13 pounds because she cannot eat, eating causes her stomach pain. She has had a surgery for scar revision in the past month from her hysterectomy, she is not having any pain in her incision site. No fever or drainage. No nausea or vomiting.    Past Medical History  Diagnosis Date  . Depression   . GERD (gastroesophageal reflux disease)     pyloric stenosis   Past Surgical History  Procedure Laterality Date  . Cesarean section      x2  . Lipoma excision N/A 09/30/2013    Procedure: Excision of  Cesarean section scar lesion ;  Surgeon: Tereso NewcomerUgonna A Anyanwu, MD;  Location: [facility];  Service: Gynecology;  Laterality: N/A;   Family History  Problem Relation Age of Onset  . Diabetes Mother   . Hypertension Mother   . Diabetes Father   . Diabetes Maternal Uncle   . Heart disease Paternal Grandfather    History  Substance Use Topics  . Smoking status: Never Smoker   . Smokeless tobacco: Never Used  . Alcohol Use: No   OB History   Grav Para Term Preterm Abortions TAB SAB Ect Mult Living   3 2 1 1 1  0 1 0 0 2     Review of Systems  Constitutional: Positive for fatigue.  Gastrointestinal: Positive for abdominal pain and diarrhea. Negative for nausea, vomiting and blood in  stool.  Skin: Positive for wound.  All other systems reviewed and are negative.    Allergies  Review of patient's allergies indicates no known allergies.  Home Medications   Current Outpatient Rx  Name  Route  Sig  Dispense  Refill  . omeprazole (PRILOSEC) 20 MG capsule   Oral   Take 20 mg by mouth 2 (two) times daily as needed (heartburn).          . sertraline (ZOLOFT) 50 MG tablet   Oral   Take 50 mg by mouth at bedtime. Spanish language instructions         . docusate sodium (COLACE) 100 MG capsule   Oral   Take 1 capsule (100 mg total) by mouth 2 (two) times daily as needed.   30 capsule   2   . ibuprofen (ADVIL,MOTRIN) 600 MG tablet   Oral   Take 1 tablet (600 mg total) by mouth every 6 (six) hours as needed.   30 tablet   1   . metroNIDAZOLE (FLAGYL) 500 MG tablet   Oral   Take 1 tablet (500 mg total) by mouth 3 (three) times daily.   42 tablet   0   . oxyCODONE-acetaminophen (PERCOCET/ROXICET) 5-325 MG per tablet   Oral   Take 1-2 tablets by mouth every 6 (six) hours as needed.   30 tablet   0   .  ranitidine (ZANTAC) 150 MG capsule   Oral   Take 1 capsule (150 mg total) by mouth 2 (two) times daily.   60 capsule   0   . sucralfate (CARAFATE) 1 GM/10ML suspension   Oral   Take 10 mLs (1 g total) by mouth 4 (four) times daily -  with meals and at bedtime.   420 mL   0    BP 127/61  Pulse 80  Temp(Src) 98 F (36.7 C) (Oral)  Resp 18  SpO2 100%  LMP 10/03/2013 Physical Exam  Nursing note and vitals reviewed. Constitutional: She is oriented to person, place, and time. Vital signs are normal. She appears well-developed and well-nourished. No distress.  HENT:  Head: Normocephalic and atraumatic.  Cardiovascular: Normal rate, regular rhythm and normal heart sounds.  Exam reveals no gallop and no friction rub.   No murmur heard. Pulmonary/Chest: Effort normal and breath sounds normal. No respiratory distress. She has no wheezes. She has no  rales.  Abdominal: Normal appearance and bowel sounds are normal. She exhibits no mass. There is tenderness in the epigastric area, periumbilical area and left upper quadrant. There is no rigidity, no rebound, no guarding, no tenderness at McBurney's point and negative Murphy's sign.    Neurological: She is alert and oriented to person, place, and time. She has normal strength. Coordination normal.  Skin: Skin is warm and dry. No rash noted. She is not diaphoretic.  Psychiatric: She has a normal mood and affect. Judgment normal.    ED Course  Procedures (including critical care time) Labs Review Labs Reviewed  POCT URINALYSIS DIP (DEVICE) - Abnormal; Notable for the following:    Hgb urine dipstick TRACE (*)    All other components within normal limits  CLOSTRIDIUM DIFFICILE BY PCR  STOOL CULTURE  POCT PREGNANCY, URINE   Imaging Review No results found.   MDM   1. Antibiotic-associated diarrhea   2. Gastritis    Suspect antibiotic associated diarrhea,she will bring back stool sample to test CDif PCRand stool culture, we'll start metronidazole empirically. She may be dealing with treatment refractory H. pylori as well. Change omeprazole to high dose Zantac and Carafate. She will be following up with GI within a few days, stressed importance of this   Meds ordered this encounter  Medications  . metroNIDAZOLE (FLAGYL) 500 MG tablet    Sig: Take 1 tablet (500 mg total) by mouth 3 (three) times daily.    Dispense:  42 tablet    Refill:  0    Order Specific Question:  Supervising Provider    Answer:  Linna Hoff [id number]  . ranitidine (ZANTAC) 150 MG capsule    Sig: Take 1 capsule (150 mg total) by mouth 2 (two) times daily.    Dispense:  60 capsule    Refill:  0    Order Specific Question:  Supervising Provider    Answer:  Linna Hoff [phone]  . sucralfate (CARAFATE) 1 GM/10ML suspension    Sig: Take 10 mLs (1 g total) by mouth 4 (four) times daily -  with meals and at  bedtime.    Dispense:  420 mL    Refill:  0    Order Specific Question:  Supervising Provider    Answer:  Bradd Canary D [[id number]]     Graylon Good, PA-C 10/20/13 1005

## 2013-10-19 NOTE — ED Notes (Signed)
33 yr old Hispanic female is here with her nephew with complaints of stomach pain and fatigue for three weeks. She states she was Dx with H. Pylori by her PCP on 09/08/13 and Rx, Amoxicillin, Omeprazole and Clarithomycin but has not had any relief. She states she follow up with her PCP and was told that the stomach pain was normal. Denies: SOB, Chest pain, fever

## 2013-10-19 NOTE — MAU Provider Note (Signed)
Pt seen and examined.  Area is healing well.  Hematoma is resolving. Attestation of Attending Supervision of Advanced Practitioner (CNM/NP): Evaluation and management procedures were performed by the Advanced Practitioner under my supervision and collaboration. I have reviewed the Advanced Practitioner's note and chart, and I agree with the management and plan.  Aidenjames Heckmann H. 6:03 AM

## 2013-10-20 LAB — CLOSTRIDIUM DIFFICILE BY PCR: Toxigenic C. Difficile by PCR: NEGATIVE

## 2013-10-20 NOTE — ED Provider Notes (Signed)
Medical screening examination/treatment/procedure(s) were performed by resident physician or non-physician practitioner and as supervising physician I was immediately available for consultation/collaboration.   Armie Moren DOUGLAS MD.   Yanel Dombrosky D Miguel Medal, MD 10/20/13 2130 

## 2013-10-22 ENCOUNTER — Telehealth (HOSPITAL_COMMUNITY): Payer: Self-pay | Admitting: *Deleted

## 2013-10-22 NOTE — ED Notes (Signed)
I called pt. and told her to stop the Flagyl because the C. Diff test was negative.  She said she does not speak [place]iaEnglish.  I told her we did not have anyone here now that speaks [place]. I asked if anyone there spoke [place]iaEnglish.  She put her 33 yo daughter on the phone and gave her the information to relay to her mother. [name], [name] M 10/22/2013

## 2013-10-24 LAB — STOOL CULTURE

## 2013-10-24 NOTE — ED Notes (Signed)
Stool culture neg. 10/24/2013

## 2013-10-26 ENCOUNTER — Ambulatory Visit (INDEPENDENT_AMBULATORY_CARE_PROVIDER_SITE_OTHER): Payer: No Typology Code available for payment source | Admitting: Obstetrics & Gynecology

## 2013-10-26 ENCOUNTER — Encounter: Payer: Self-pay | Admitting: Obstetrics & Gynecology

## 2013-10-26 VITALS — BP 105/64 | HR 86 | Temp 98.0°F | Ht <= 58 in | Wt 133.2 lb

## 2013-10-26 DIAGNOSIS — N806 Endometriosis in cutaneous scar: Secondary | ICD-10-CM

## 2013-10-26 DIAGNOSIS — Z09 Encounter for follow-up examination after completed treatment for conditions other than malignant neoplasm: Secondary | ICD-10-CM

## 2013-10-26 NOTE — Patient Instructions (Signed)
Regrese a la clinica cuando tenga su cita. Si tiene problemas o preguntas, llama a la clinica o vaya a la sala de emergencia al [facility].    

## 2013-10-26 NOTE — Progress Notes (Signed)
Pt here for postop appt. States that she is doing well, has no pain.

## 2013-10-26 NOTE — Progress Notes (Signed)
   CLINIC ENCOUNTER NOTE  History:  33 y.o. [id number] here today for follow up after excision of cesarean section scar lesion on 09/30/13.  Lesion was diagnosed as an endometrioma after pathology analysis.  Patient is Spanish-speaking only, Spanish interpreter present for this encounter. Patient reports that she had some pain and felt a mass after surgery, she was evaluated on 10/10/13 at [facility] and was felt to have a subcutaneous hematoma.  She was told to follow up in clinic. She currently reports decreased pain but reports that mass is still present. No other concerns.  The following portions of the patient's history were reviewed and updated as appropriate: allergies, current medications, past family history, past medical history, past social history, past surgical history and problem list.  Review of Systems:  Pertinent items are noted in HPI.  Objective:  Physical Exam BP 105/64  Pulse 86  Temp(Src) 98 F (36.7 C) (Oral)  Ht 4\' 8"  (1.422 m)  Wt 133 lb 3.2 oz (60.419 kg)  BMI 29.88 kg/m2  LMP 10/03/2013 Gen: NAD Abd: Soft, nontender and nondistended. Incision clean dry and intact. 2 cm firm lesion palpated in the area of previous excision, mild TTP, no erythema/ecchymosis noted. No drainage. Pelvic: Deferred  Labs and Imaging Pathology 09/30/13 SCAR WITH EXTENSIVE ENDOMETRIOSIS. NO EVIDENCE OF MALIGNANCY.  Assessment & Plan:  Postoperative hematoma/hypertrophic scar tissue after excision of subcutaneous endometrioma on 09/30/13.  Patient does not want further surgery at this time, expectant management recommended.  If pain continues, may consider intralesional corticosteroids or another excision. Pain precautions reviewed.   Jaynie CollinsUGONNA  Eva Vallee, MD, FACOG Attending Obstetrician & Gynecologist Faculty Practice, [facility]

## 2013-11-15 ENCOUNTER — Emergency Department (HOSPITAL_COMMUNITY): Payer: No Typology Code available for payment source

## 2013-11-15 ENCOUNTER — Emergency Department (HOSPITAL_COMMUNITY)
Admission: EM | Admit: 2013-11-15 | Discharge: 2013-11-15 | Disposition: A | Discharge status: Home / Self Care | Payer: No Typology Code available for payment source | Attending: Emergency Medicine | Admitting: Emergency Medicine

## 2013-11-15 ENCOUNTER — Encounter (HOSPITAL_COMMUNITY): Payer: Self-pay | Admitting: Emergency Medicine

## 2013-11-15 DIAGNOSIS — K8020 Calculus of gallbladder without cholecystitis without obstruction: Secondary | ICD-10-CM | POA: Insufficient documentation

## 2013-11-15 DIAGNOSIS — Z8619 Personal history of other infectious and parasitic diseases: Secondary | ICD-10-CM | POA: Insufficient documentation

## 2013-11-15 DIAGNOSIS — K2990 Gastroduodenitis, unspecified, without bleeding: Principal | ICD-10-CM

## 2013-11-15 DIAGNOSIS — Z3202 Encounter for pregnancy test, result negative: Secondary | ICD-10-CM | POA: Insufficient documentation

## 2013-11-15 DIAGNOSIS — K219 Gastro-esophageal reflux disease without esophagitis: Secondary | ICD-10-CM | POA: Insufficient documentation

## 2013-11-15 DIAGNOSIS — K2970 Gastritis, unspecified, without bleeding: Secondary | ICD-10-CM

## 2013-11-15 DIAGNOSIS — R197 Diarrhea, unspecified: Secondary | ICD-10-CM | POA: Insufficient documentation

## 2013-11-15 DIAGNOSIS — R1012 Left upper quadrant pain: Secondary | ICD-10-CM | POA: Insufficient documentation

## 2013-11-15 DIAGNOSIS — F329 Major depressive disorder, single episode, unspecified: Secondary | ICD-10-CM | POA: Insufficient documentation

## 2013-11-15 DIAGNOSIS — Z79899 Other long term (current) drug therapy: Secondary | ICD-10-CM | POA: Insufficient documentation

## 2013-11-15 DIAGNOSIS — F3289 Other specified depressive episodes: Secondary | ICD-10-CM | POA: Insufficient documentation

## 2013-11-15 LAB — CBC WITH DIFFERENTIAL/PLATELET
BASOS ABS: 0 10*3/uL (ref 0.0–0.1)
Basophils Relative: 0 % (ref 0–1)
EOS PCT: 0 % (ref 0–5)
Eosinophils Absolute: 0 10*3/uL (ref 0.0–0.7)
HEMATOCRIT: 35.7 % — AB (ref 36.0–46.0)
Hemoglobin: 11.4 g/dL — ABNORMAL LOW (ref 12.0–15.0)
LYMPHS PCT: 20 % (ref 12–46)
Lymphs Abs: 1.6 10*3/uL (ref 0.7–4.0)
MCH: 27.8 pg (ref 26.0–34.0)
MCHC: 31.9 g/dL (ref 30.0–36.0)
MCV: 87.1 fL (ref 78.0–100.0)
MONO ABS: 0.4 10*3/uL (ref 0.1–1.0)
Monocytes Relative: 4 % (ref 3–12)
NEUTROS ABS: 6.1 10*3/uL (ref 1.7–7.7)
Neutrophils Relative %: 76 % (ref 43–77)
PLATELETS: 357 10*3/uL (ref 150–400)
RBC: 4.1 MIL/uL (ref 3.87–5.11)
RDW: 14.8 % (ref 11.5–15.5)
WBC: 8.1 10*3/uL (ref 4.0–10.5)

## 2013-11-15 LAB — URINALYSIS, ROUTINE W REFLEX MICROSCOPIC
BILIRUBIN URINE: NEGATIVE
GLUCOSE, UA: NEGATIVE mg/dL
Hgb urine dipstick: NEGATIVE
Ketones, ur: NEGATIVE mg/dL
Leukocytes, UA: NEGATIVE
Nitrite: NEGATIVE
Protein, ur: NEGATIVE mg/dL
Specific Gravity, Urine: 1.018 (ref 1.005–1.030)
UROBILINOGEN UA: 0.2 mg/dL (ref 0.0–1.0)
pH: 6 (ref 5.0–8.0)

## 2013-11-15 LAB — COMPREHENSIVE METABOLIC PANEL
ALT: 22 U/L (ref 0–35)
AST: 19 U/L (ref 0–37)
Albumin: 4.1 g/dL (ref 3.5–5.2)
Alkaline Phosphatase: 70 U/L (ref 39–117)
BILIRUBIN TOTAL: 0.2 mg/dL — AB (ref 0.3–1.2)
BUN: 10 mg/dL (ref 6–23)
CHLORIDE: 103 meq/L (ref 96–112)
CO2: 24 mEq/L (ref 19–32)
Calcium: 9.4 mg/dL (ref 8.4–10.5)
Creatinine, Ser: 0.44 mg/dL — ABNORMAL LOW (ref 0.50–1.10)
GFR calc non Af Amer: >90 mL/min (ref >90)
GLUCOSE: 102 mg/dL — AB (ref 70–99)
Potassium: 4.1 mEq/L (ref 3.7–5.3)
SODIUM: 140 meq/L (ref 137–147)
Total Protein: 7.6 g/dL (ref 6.0–8.3)

## 2013-11-15 LAB — POC URINE PREG, ED: Preg Test, Ur: NEGATIVE

## 2013-11-15 LAB — LIPASE, BLOOD: Lipase: 23 U/L (ref 11–59)

## 2013-11-15 MED ORDER — SUCRALFATE 1 G PO TABS
1.0000 g | ORAL_TABLET | Freq: Once | ORAL | Status: AC
  Administered 2013-11-15: 1 g via ORAL
  Filled 2013-11-15: qty 1

## 2013-11-15 MED ORDER — ONDANSETRON 4 MG PO TBDP
4.0000 mg | ORAL_TABLET | Freq: Once | ORAL | Status: AC
  Administered 2013-11-15: 4 mg via ORAL
  Filled 2013-11-15: qty 1

## 2013-11-15 MED ORDER — SUCRALFATE 1 G PO TABS: 1.0000 g | ORAL_TABLET | Freq: Four times a day (QID) | ORAL | Status: DC

## 2013-11-15 MED ORDER — GI COCKTAIL ~~LOC~~
30.0000 mL | Freq: Once | ORAL | Status: AC
  Administered 2013-11-15: 30 mL via ORAL
  Filled 2013-11-15: qty 30

## 2013-11-15 NOTE — ED Notes (Signed)
Pt presents to department for evaluation of epigastric pain. Ongoing x2 months. Also states nausea and diarrhea. 8/10 pain at the time. States she was treated for H. Pylori. Pt is alert and oriented x4.

## 2013-11-15 NOTE — Discharge Instructions (Signed)
Colelitiasis (Cholelithiasis) La colelitiasis (tambin llamada clculos en la vescula) es una enfermedad en la que se forman piedras en la vescula. La vescula es un rgano que almacena la bilis que se forma en el hgado y que ayuda a Engineer, agriculturaldigerir grasas. Los clculos comienzan como pequeos cristales y lentamente se transforman en piedras. El dolor en la vescula ocurre cuando se producen espasmos y los clculos obstruyen el conducto. El dolor tambin se produce cuando una piedra sale por el conducto.  FACTORES DE RIESGO  Ser mujer.   Tener embarazos mltiples. Algunas veces los mdicos aconsejan extirpar los clculos biliares antes de futuros embarazos.   Ser obeso.  Dietas que incluyan comidas fritas y grasas.   Ser mayor de 2960 aos y el aumento de la edad.   El uso prolongado de medicamentos que contengan hormonas femeninas.   Tener diabetes mellitus.   Prdida rpida de peso.   Historia familiar de clculos (herencia).  SNTOMAS  Nuseas.   Vmitos.  Dolor abdominal.   Piel amarilla (ictericia)   Dolor sbito. Puede persistir desde algunos minutos hasta algunas horas.  Grant RutsFiebre.   Sensibilidad al tacto. En algunos casos, cuando los clculos biliares no se mueven hacia el conducto biliar, las personas no sienten dolor ni presentan sntomas. Estos se denominan clculos "silenciosos".  TRATAMIENTO Los clculos silenciosos no requieren TEFL teachertratamiento. En los Illinois Tool Workscasos graves, podr ser Bangladeshnecesaria una ciruga de urgencia. Las opciones de tratamiento son:  Kandis BanCiruga para extirpar la vescula. Es el tratamiento ms frecuente.  Medicamentos. No siempre dan resultado y pueden demorar entre 6 y 12 meses o ms en Scientist, water qualityhacer efecto.  Tratamiento con ondas de choque (litotricia biliar extracorporal). En este tratamiento, una mquina de ultrasonido enva ondas de choque a la vescula para destruir los clculos en pequeos fragmentos que luego podrn pasar a los intestinos o ser disueltas  con medicamentos. INSTRUCCIONES PARA EL CUIDADO EN EL HOGAR   Slo tome medicamentos de venta libre o recetados para Primary school teachercalmar el dolor, Environmental health practitionerel malestar o bajar la fiebre, segn las indicaciones de su mdico.   Siga una dieta baja en grasas hasta que su mdico lo vea nuevamente. Las grasas hacen que la vescula se Technical sales engineercontraiga, lo que puede Engineer, agriculturalproducir dolor.   Concurra a las consultas de control con su mdico segn las indicaciones. Los ataques casi siempre son recurrentes y generalmente habr que someterse a una ciruga como Huntleightratamiento permanente.  SOLICITE ATENCIN MDICA DE INMEDIATO SI:   El dolor aumenta y no puede controlarlo con los medicamentos.   Tiene fiebre o sntomas persistentes durante ms de 2 - 3 das.   Tiene fiebre y los sntomas empeoran repentinamente.   Tiene nuseas o vmitos persistentes.  ASEGRESE DE QUE:   Comprende estas instrucciones.  Controlar su afeccin.  Recibir ayuda de inmediato si no mejora o si empeora. Document Released: 05/02/2006 Document Revised: 03/18/2013 [facility] Patient Information 2014 [place]ExitCare, MarylandLLC.  Gastritis - Adultos  (Gastritis, Adult)  La gastrittis es la irritacin (inflamacin) de la membrana interna del estmago. Puede ser Neomia Dearuna enfermedad de inicio sbito (aguda) o de largo plazo (crnica). Si la gastritis no se trata, puede causar sangrado y lceras. CAUSAS  La gastritis se produce cuando la membrana que tapiza interiormente al estmago se debilita o se daa. Los jugos digestivos del estmago inflaman el revestimiento del estmago debilitado. El revestimiento del estmago puede debilitarse o daarse por una infeccin viral o bacteriana. La infeccin bacteriana ms comn es la infeccin por Helicobacter pylori. Tambin puede ser el  resultado del consumo excesivo de alcohol, por el uso de ciertos medicamentos o porque hay demasiado cido en el estmago.  SNTOMAS  En algunos casos no hay sntomas. Si se presentan sntomas, stos  pueden ser:   Dolor o sensacin de ardor en la parte superior del abdomen.  Nuseas.  Vmitos.  Sensacin molesta de distensin despus de comer. DIAGNSTICO  El mdico puede diagnosticar gastritis segn los sntomas y el examen fsico. Para determinar la causa de la gastritis, el mdico podr:   Pedir anlisis de sangre o de materia fecal para diagnosticar la presencia de la bacteria H pylori.  Gastroscopa. Un tubo delgado y flexible (endoscopio) se pasa por Theatre stage managerel esfago hasta llegar al Teachers Insurance and Annuity Associationestmago. El endoscopio tiene [place]a luz y una cmara en el extremo. El mdico utilizar el endoscopio para observar el interior del Daubervilleestmago.  Tomar una muestra de tejido (biopsia) del estmago para examinarlo en el microscopio. TRATAMIENTO  Segn la causa de la gastritis podrn recetarle: Antibiticos, si la causa es una infeccin bacteriana, como una infeccin por H. pylori. Anticidos o bloqueadores H2, si hay demasiado cido en el estmago. El Office Depotmdico le aconsejar que deje de tomar aspirina, ibuprofeno u otros antiinflamatorios no esteroides (AINE).  INSTRUCCIONES PARA EL CUIDADO EN EL HOGAR   Tome slo medicamentos de venta libre o recetados, segn las indicaciones del mdico.  Si le han recetado antibiticos, tmelos segn las indicaciones. Tmelos todos, aunque se sienta mejor.  Debe ingerir gran cantidad de lquido para mantener la orina de tono claro o color amarillo plido.  Evite las comidas y bebidas que [address] Avenueempeoran los Irondaleproblemas, Georgiacomo:  MinnesotaBebidas con cafena o alcohlicas.  Chocolate.  Sabores a Advertising account plannermenta.  Ajo y cebolla.  Comidas muy condimentadas.  Ctricos como naranjas, limones o limas.  Alimentos que contengan tomate, como salsas, [place]ub[place]ile y pizza.  Alimentos fritos y Lexicographergrasos.  Haga comidas pequeas durante Glass blower/designerel da en lugar de 3 comidas abundantes. SOLICITE ATENCIN MDICA DE INMEDIATO SI:   La materia fecal es negra o de color rojo oscuro.  Vomita sangre de color rojo brillante o  material similar a granos de caf.  No puede retener los lquidos.  El dolor abdominal empeora.  Tiene fiebre.  No mejora luego de 1 semana.  Tiene preguntas o preocupaciones. ASEGRESE DE QUE:   Comprende estas instrucciones.  Controlar su enfermedad.  Solicitar ayuda de inmediato si no mejora o si empeora. Document Released: 04/25/2005 Document Revised: 04/09/2012 [facility] Patient Information 2014 [place]ornExitCare, MarylandLLC.

## 2013-11-15 NOTE — ED Provider Notes (Signed)
CSN: [id number]     Arrival date & time 11/15/13  4098 History   First MD Initiated Contact with Patient 11/15/13 1025     Chief Complaint  Patient presents with  . Abdominal Pain     (Consider location/radiation/quality/duration/timing/severity/associated sxs/prior Treatment) Patient is a 33 y.o. female presenting with abdominal pain. The history is provided by the patient.  Abdominal Pain Pain location:  Epigastric Pain quality: aching   Pain radiates to:  Back Pain severity:  Moderate Onset quality:  Gradual Duration:  8 weeks Timing:  Constant Progression:  Worsening Chronicity:  Recurrent Context comment:  Has been treated for H pylori twice Relieved by:  Nothing Worsened by:  Nothing tried Associated symptoms: diarrhea and nausea   Associated symptoms: no anorexia, no belching, no chills, no cough, no fever, no shortness of breath and no vomiting     Past Medical History  Diagnosis Date  . Depression   . GERD (gastroesophageal reflux disease)     pyloric stenosis   Past Surgical History  Procedure Laterality Date  . Cesarean section      x2  . Lipoma excision N/A 09/30/2013    Procedure: Excision of  Cesarean section scar lesion ;  Surgeon: Tereso Newcomer, MD;  Location: [facility];  Service: Gynecology;  Laterality: N/A;   Family History  Problem Relation Age of Onset  . Diabetes Mother   . Hypertension Mother   . Diabetes Father   . Diabetes Maternal Uncle   . Heart disease Paternal Grandfather    History  Substance Use Topics  . Smoking status: Never Smoker   . Smokeless tobacco: Never Used  . Alcohol Use: No   OB History   Grav Para Term Preterm Abortions TAB SAB Ect Mult Living   3 2 1 1 1  0 1 0 0 2     Review of Systems  Constitutional: Negative for fever and chills.  Respiratory: Negative for cough and shortness of breath.   Gastrointestinal: Positive for nausea and diarrhea. Negative for vomiting, abdominal pain and anorexia.  All other  systems reviewed and are negative.     Allergies  Review of patient's allergies indicates no known allergies.  Home Medications   Prior to Admission medications   Medication Sig Start Date End Date Taking? Authorizing Provider  ibuprofen (ADVIL,MOTRIN) 600 MG tablet Take 1 tablet (600 mg total) by mouth every 6 (six) hours as needed. 09/30/13  Yes Tereso Newcomer, MD  omeprazole (PRILOSEC) 20 MG capsule Take 20 mg by mouth 2 (two) times daily as needed (heartburn).    Yes Historical Provider, MD  ranitidine (ZANTAC) 150 MG capsule Take 1 capsule (150 mg total) by mouth 2 (two) times daily. 10/19/13  Yes Adrian Blackwater Baker, PA-C  sertraline (ZOLOFT) 50 MG tablet Take 50 mg by mouth at bedtime. Spanish language instructions 06/06/11  Yes Barbaraann Barthel, MD  sucralfate (CARAFATE) 1 GM/10ML suspension Take 10 mLs (1 g total) by mouth 4 (four) times daily -  with meals and at bedtime. 10/19/13  Yes Zachary H Baker, PA-C   BP 97/41  Pulse 94  Temp(Src) 98.2 F (36.8 C) (Oral)  Resp 18  SpO2 100% Physical Exam  Nursing note and vitals reviewed. Constitutional: She is oriented to person, place, and time. She appears well-developed and well-nourished. No distress.  HENT:  Head: Normocephalic and atraumatic.  Eyes: EOM are normal. Pupils are equal, round, and reactive to light.  Neck: Normal range of motion. Neck  supple.  Cardiovascular: Normal rate and regular rhythm.  Exam reveals no friction rub.   No murmur heard. Pulmonary/Chest: Effort normal and breath sounds normal. No respiratory distress. She has no wheezes. She has no rales.  Abdominal: Soft. She exhibits no distension. There is tenderness (epigastric, LUQ). There is no rebound.  Musculoskeletal: Normal range of motion. She exhibits no edema.  Neurological: She is alert and oriented to person, place, and time. No cranial nerve deficit. She exhibits normal muscle tone. Coordination normal.  Skin: No rash noted. She is not diaphoretic.     ED Course  Procedures (including critical care time) Labs Review Labs Reviewed  CBC WITH DIFFERENTIAL - Abnormal; Notable for the following:    Hemoglobin 11.4 (*)    HCT 35.7 (*)    All other components within normal limits  COMPREHENSIVE METABOLIC PANEL - Abnormal; Notable for the following:    Glucose, Bld 102 (*)    Creatinine, Ser 0.44 (*)    Total Bilirubin 0.2 (*)    All other components within normal limits  LIPASE, BLOOD  URINALYSIS, ROUTINE W REFLEX MICROSCOPIC  POC URINE PREG, ED    Imaging Review [place] Abdomen Limited  11/15/2013   CLINICAL DATA:  Abdominal pain.  EXAM: US ABDOMEN LIMITED - RIGHT UPPER QUADRANT  COMPARISON:  None.  FINDINGS: Gallbladder:  Wall echo shadow complex seen, consistent with numerous gallstones filling gallbladder lumen. This limits visualization of gallbladder wall, however no definite abnormal wall thickening demonstrated. No sonographic Murphy sign noted by sonographer.  Common bile duct:  Diameter: 4 mm  Liver:  No focal lesion identified. Within normal limits in parenchymal echogenicity.  IMPRESSION: Numerous gallstones filling gallbladder lumen.  No evidence of biliary duct dilatation.   Electronically Signed   By: Myles RosenthalJohn  Stahl M.D.   On: 11/15/2013 13:53   Dg Abd Acute W/chest  11/15/2013   CLINICAL DATA:  Upper mid abdominal pain with nausea.  EXAM: ACUTE ABDOMEN SERIES (ABDOMEN 2 VIEW & CHEST 1 VIEW)  COMPARISON:  Chest x-ray 09/28/2010.  FINDINGS: Lung volumes are normal. No consolidative airspace disease. No pleural effusions. No pneumothorax. No pulmonary nodule or mass noted. Pulmonary vasculature and the cardiomediastinal silhouette are within normal limits.  Gas and stool are seen scattered throughout the colon extending to the level of the distal rectum. No pathologic distension of small bowel is noted. Several nondilated loops of gas-filled small bowel are noted in the central abdomen. Multiple faintly calcified curvilinear densities  are seen projecting over the right upper quadrant of the abdomen. No gross evidence of pneumoperitoneum.  IMPRESSION: 1. Findings suggest multiple partially calcified gallstones in the gallbladder. Clinical correlation for signs and symptoms of cholecystitis is suggested, with consideration for further evaluation by right upper quadrant ultrasound if clinically appropriate. 2. Nonspecific, nonobstructive bowel gas pattern. 3. No pneumoperitoneum. 4. No radiographic evidence of acute cardiopulmonary disease.   Electronically Signed   By: Trudie Reedaniel  Entrikin M.D.   On: 11/15/2013 11:34     EKG Interpretation None      MDM   Final diagnoses:  Gastritis  Gallstones    55F with hx of H. Pylori, has been treated for H. Pylori twice. Pain for past 2 months, worse over past 3 days. Epigastric, radiating to back. Here vitals with soft BP, will recheck. Mild epigastric pain, no rebound or guarding. Will do AAS to look for possible free air. Will give GI cocktail, carafate.  Patient is feeling much better after carafate and GI cocktail.  AAS shows cholelithiasis, recommended US. US normal, no acute cholecystitis. Patient stable for discharge, given carafate, instructed to f/u with PCP or General Surgery if gallstones become symptomatic.   Dagmar HaitWilliam Jode Lippe, MD 11/15/13 1425

## 2013-11-15 NOTE — ED Notes (Signed)
Pt [place]

## 2013-12-02 ENCOUNTER — Ambulatory Visit (HOSPITAL_COMMUNITY)
Admission: RE | Admit: 2013-12-02 | Payer: No Typology Code available for payment source | Source: Ambulatory Visit | Admitting: Gastroenterology

## 2013-12-02 ENCOUNTER — Encounter (HOSPITAL_COMMUNITY): Admission: RE | Payer: Self-pay | Source: Ambulatory Visit

## 2013-12-02 ENCOUNTER — Encounter (HOSPITAL_COMMUNITY): Payer: Self-pay | Admitting: *Deleted

## 2013-12-02 ENCOUNTER — Ambulatory Visit (HOSPITAL_COMMUNITY)
Admission: RE | Admit: 2013-12-02 | Discharge: 2013-12-02 | Disposition: A | Discharge status: Home / Self Care | Payer: No Typology Code available for payment source | Source: Ambulatory Visit | Attending: Gastroenterology | Admitting: Gastroenterology

## 2013-12-02 ENCOUNTER — Encounter (HOSPITAL_COMMUNITY)
Admission: RE | Disposition: A | Discharge status: Home / Self Care | Payer: Self-pay | Source: Ambulatory Visit | Attending: Gastroenterology

## 2013-12-02 DIAGNOSIS — R143 Flatulence: Secondary | ICD-10-CM

## 2013-12-02 DIAGNOSIS — R141 Gas pain: Secondary | ICD-10-CM | POA: Insufficient documentation

## 2013-12-02 DIAGNOSIS — R142 Eructation: Secondary | ICD-10-CM

## 2013-12-02 DIAGNOSIS — R1013 Epigastric pain: Secondary | ICD-10-CM | POA: Insufficient documentation

## 2013-12-02 DIAGNOSIS — K311 Adult hypertrophic pyloric stenosis: Secondary | ICD-10-CM | POA: Insufficient documentation

## 2013-12-02 DIAGNOSIS — R110 Nausea: Secondary | ICD-10-CM | POA: Insufficient documentation

## 2013-12-02 DIAGNOSIS — F3289 Other specified depressive episodes: Secondary | ICD-10-CM | POA: Insufficient documentation

## 2013-12-02 DIAGNOSIS — F329 Major depressive disorder, single episode, unspecified: Secondary | ICD-10-CM | POA: Insufficient documentation

## 2013-12-02 DIAGNOSIS — E663 Overweight: Secondary | ICD-10-CM | POA: Insufficient documentation

## 2013-12-02 DIAGNOSIS — K219 Gastro-esophageal reflux disease without esophagitis: Secondary | ICD-10-CM | POA: Insufficient documentation

## 2013-12-02 DIAGNOSIS — K2940 Chronic atrophic gastritis without bleeding: Secondary | ICD-10-CM | POA: Insufficient documentation

## 2013-12-02 DIAGNOSIS — R634 Abnormal weight loss: Secondary | ICD-10-CM | POA: Insufficient documentation

## 2013-12-02 DIAGNOSIS — Z79899 Other long term (current) drug therapy: Secondary | ICD-10-CM | POA: Insufficient documentation

## 2013-12-02 HISTORY — PX: ESOPHAGOGASTRODUODENOSCOPY: SHX5428

## 2013-12-02 SURGERY — EGD (ESOPHAGOGASTRODUODENOSCOPY)
Anesthesia: Moderate Sedation

## 2013-12-02 MED ORDER — DIPHENHYDRAMINE HCL 50 MG/ML IJ SOLN
INTRAMUSCULAR | Status: AC
  Filled 2013-12-02: qty 1

## 2013-12-02 MED ORDER — MIDAZOLAM HCL 10 MG/2ML IJ SOLN
INTRAMUSCULAR | Status: AC
  Filled 2013-12-02: qty 2

## 2013-12-02 MED ORDER — BUTAMBEN-TETRACAINE-BENZOCAINE 2-2-14 % EX AERO
INHALATION_SPRAY | CUTANEOUS | Status: DC | PRN
  Administered 2013-12-02: 2 via TOPICAL

## 2013-12-02 MED ORDER — FENTANYL CITRATE 0.05 MG/ML IJ SOLN
INTRAMUSCULAR | Status: AC
  Filled 2013-12-02: qty 2

## 2013-12-02 MED ORDER — SODIUM CHLORIDE 0.9 % IV SOLN: INTRAVENOUS | Status: DC

## 2013-12-02 MED ORDER — MIDAZOLAM HCL 10 MG/2ML IJ SOLN
INTRAMUSCULAR | Status: DC | PRN
  Administered 2013-12-02: 2 mg via INTRAVENOUS
  Administered 2013-12-02: 1 mg via INTRAVENOUS
  Administered 2013-12-02 (×2): 2 mg via INTRAVENOUS

## 2013-12-02 MED ORDER — FENTANYL CITRATE 0.05 MG/ML IJ SOLN
INTRAMUSCULAR | Status: DC | PRN
  Administered 2013-12-02 (×3): 25 ug via INTRAVENOUS

## 2013-12-02 NOTE — Discharge Instructions (Addendum)
Esofagogastroduodenoscopa - Cuidados posteriores  (Esophagogastroduodenoscopy, Care After) Siga estas instrucciones durante las prximas semanas. Estas indicaciones le proporcionan informacin general acerca de cmo deber cuidarse despus del procedimiento. El mdico tambin podr darle instrucciones ms especficas. El tratamiento ha sido planificado segn las prcticas mdicas actuales, pero en algunos casos pueden ocurrir complicaciones. Comunquese con el mdico si tiene algn problema o tiene preguntas despus del procedimiento.  INSTRUCCIONES PARA EL CUIDADO EN EL HOGAR   No coma ni beba nada hasta que el medicamento para adormecer (anestsico local) haya desaparecido y su reflejo nauseoso haya vuelto. Usted sabr que el anestsico local se ha eliminado cuando usted pueda tragar cmodamente.  No conduzca vehculos por 12 horas despus de finalizado el procedimiento o segn las indicaciones de su mdico.  Tome slo la medicacin que le indic el profesional. SOLICITE ATENCIN MDICA SI:   No puede dejar de toser.  No puede orinar u orina menos que lo habitual. SOLICITE ATENCIN MDICA DE INMEDIATO SI:   Tiene dificultad para tragar.  No puede comer o beber.  Siente cada vez ms dolor en la garganta o en el pecho.  Se siente mareado, tiene vahdos o se desmaya.  Tiene nuseas o vmitos.  Tiene escalofros.  Tiene fiebre.  Siente un dolor abdominal intenso.  La materia fecal es negra, de aspecto alquitranado o tiene [place] Week[place] W[place]esangre. Document Released: 11/10/2012 [facility]Exit[facility] Patient Information 2014 [place]sonExitCare, MarylandLLC.  Sedacin moderada, adulto (Moderate Sedation, Adult) La sedacin moderada se aplica para ayudarlo a relajar o a dormir durante un procedimiento. Hasta algunas horas posteriores al procedimiento, podr Marriotttener somnolencia, sentirse torpe o tener problemas con el equilibrio. Solicite a algn adulto responsable, a un miembro de su familia o a un amigo que lo lleve Calpine Corporationhasta su  casa. Un adulto responsable deber permanecer con usted durante al menos 24 horas, o hasta que hayan desaparecido los efectos de los medicamentos.  No realice ninguna actividad en la que pueda resultar lesionado durante las 24 horas siguientes, o hasta que vuelva a sentirse normal. NO:  Conduzca vehculos.  Practique natacin.  Conduzca bicicletas.  Opere maquinarias pesadas.  Cocine.  Utilice herramientas.  Suba escaleras.  Trabaje en altura.  No firme documentos importantes ni tome decisiones trascendentes hasta que se sienta mejor.  Si usted come Lear Corporationdemasiado pronto, puede tener vmitos. Cuando usted pueda beber sin vomitar, pruebe agua, jugo o sopa. Pruebe los alimentos slidos si no siente nuseas.  Utilice los medicamentos de venta libre o de prescripcin para Chief Technology Officerel dolor, Environmental health practitionerel malestar o la Elizabethvillefiebre, segn se lo indique el profesional que lo asiste. Si le han prescrito analgsicos, consulte a su mdico cuanto tiempo deber esperar para tomarlos.  Asegrese de que usted y su familia comprenden totalmente todo lo relacionado con los medicamentos que le han administrado. Asegrese que comprende que efectos adversos puede sufrir.  No tome alcohol, pastillas para dormir o medicamentos que causen somnolencia, durante al menos 24 horas.  Si usted fuma, no lo haga solo.  Si se siente mejor, puede retomar sus actividades habituales luego de 24 horas de la sedacin.  Cumpla con todas las citas programadas y siga todas las instrucciones.  Haga todas las preguntas que tenga si no comprende Haitialguna cosa. SOLICITE ATENCIN MDICA SI:  Su piel est plida o de color azulado.  Tiene nuseas persistentes (ganas de vomitar) o vmitos.  El dolor est empeorando y no puede controlarlo con los medicamentos.  Hay hemorragia o hinchazn.  Persiste somnoliento o tembloroso despus de 24 horas. SOLICITE  ATENCIN MDICA DE INMEDIATO SI:  Usted presenta una erupcin cutnea.  Usted siente  dificultad para respirar.  Usted tiene algn otro problema de Programmer, multimediaalergia.  Tiene fiebre. Document Released: 04/25/2005 Document Revised: 10/08/2011 [facility] CenterExit[facility] Patient Information 2014 [place]apervilleExitCare, MarylandLLC.

## 2013-12-02 NOTE — H&P (Signed)
Marie Gray is an 33 y.o. female.   Chief Complaint: Epigastric pain  HPI: The patient was seen by me in the office about a week ago with a roughly 5 month history of epigastric pain associated with an unintentional weight loss of about 18 pounds. She also has had nausea and some bloating. She has not been on ulcerogenic medications. She has not had relief to omeprazole 20 mg daily despite several months of treatment. She was found to be H. pylori positive and was treated for that, completing therapy about 6 weeks ago. She was Hemoccult negative in the office. We added sucralfate to her regimen and, since then, she has noticed some degree of improvement but not complete resolution of symptoms.  Past Medical History  Diagnosis Date  . Depression   . GERD (gastroesophageal reflux disease)     pyloric stenosis    Past Surgical History  Procedure Laterality Date  . Cesarean section      x2  . Lipoma excision N/A 09/30/2013    Procedure: Excision of  Cesarean section scar lesion ;  Surgeon: Tereso NewcomerUgonna A Anyanwu, MD;  Location: [facility];  Service: Gynecology;  Laterality: N/A;    Family History  Problem Relation Age of Onset  . Diabetes Mother   . Hypertension Mother   . Diabetes Father   . Diabetes Maternal Uncle   . Heart disease Paternal Grandfather    Social History:  reports that she has never smoked. She has never used smokeless tobacco. She reports that she does not drink alcohol or use illicit drugs.  Allergies: No Known Allergies  Medications Prior to Admission  Medication Sig Dispense Refill  . ibuprofen (ADVIL,MOTRIN) 600 MG tablet Take 1 tablet (600 mg total) by mouth every 6 (six) hours as needed.  30 tablet  1  . omeprazole (PRILOSEC) 20 MG capsule Take 20 mg by mouth 2 (two) times daily as needed (heartburn).       . sertraline (ZOLOFT) 50 MG tablet Take 50 mg by mouth at bedtime. Spanish language instructions      . sucralfate (CARAFATE) 1 G tablet Take 1 tablet (1 g  total) by mouth 4 (four) times daily.  40 tablet  0  . ranitidine (ZANTAC) 150 MG capsule Take 1 capsule (150 mg total) by mouth 2 (two) times daily.  60 capsule  0  . sucralfate (CARAFATE) 1 GM/10ML suspension Take 10 mLs (1 g total) by mouth 4 (four) times daily -  with meals and at bedtime.  420 mL  0    No results found for this or any previous visit (from the past 48 hour(s)). No results found.  ROS see history of present illness  Blood pressure 111/63, pulse 87, temperature 97.9 F (36.6 C), temperature source Oral, resp. rate 9, height 4\' 6"  (1.372 m), weight 57.607 kg (127 lb), last menstrual period 11/08/2013, SpO2 100.00%. Physical Exam a mildly overweight, pleasant Hispanic female in no distress. Chest is clear, heart is without murmur or arrhythmia, and abdomen is mildly adipose but without mass effect or tenderness. Oropharynx is benign.  Assessment/Plan Abdominal pain and weight loss, only mildly responsive to antipeptic therapy.  We will proceed to endoscopic evaluation today, the nature, purpose, and risks of which have been discussed with the patient  with the help of her friend in the office who acted as interpreter.  Katy FitchRobert V Marquiz Sotelo 12/02/2013, 8:29 AM

## 2013-12-02 NOTE — Op Note (Signed)
[facility] [address] [place], [id number]   ENDOSCOPY PROCEDURE REPORT  PATIENT: Marie Gray, Marie G.  MR#: [id number] BIRTHDATE: 12-Oct-1980 , 33  yrs. old GENDER: Female ENDOSCOPIST:Dereon Williamsen, MD REFERRED BY:  Julieanne MansonElizabeth Mulberry, MD PROCEDURE DATE:  12/02/2013 PROCEDURE:      upper endoscopy with biopsies ASA CLASS: INDICATIONS:   epigastric pain, nausea, 18 pound weight loss, and absence of response to antipeptic therapy MEDICATION:    fentanyl 75 mcg IV, Versed 7 mg IV TOPICAL ANESTHETIC:    Cetacaine spray  DESCRIPTION OF PROCEDURE:   the [facility]. time out was performed, and the patient received the above sedation, remained completely stable throughout the procedure.  The Pentax adult video endoscope was passed under direct vision. The vocal cords looked normal, although there was some erythema of the arytenoid cartilages. The esophagus was entered under direct vision without significant difficulty.  The esophagus was normal. It had normal mucosa, without evidence of reflux esophagitis or Barrett's esophagus, and no hiatal hernia, ring, stricture, infection, neoplasia, or varices were noted.  The stomach was entered. It contained no residual, and had normal-appearing motility. There was no evidence of bile reflux. The stomach was similarly normal, without inflammatory changes such as erythema, nor focal abnormalities such as erosions, ulcers, polyps, or masses, including a retroflexed view of the cardia.  The pylorus, duodenal bulb, and second duodenum looked normal. There was no evidence of ulcers or endoscopic stigmata of celiac disease.  Prior to removal of the scope, I obtained random duodenal and gastric antral biopsies.  The patient tolerated the procedure well.      COMPLICATIONS: None  ENDOSCOPIC IMPRESSION:  1. Normal endoscopy. No source for patient's symptoms  endoscopically evident.  RECOMMENDATIONS:  1. Await pathology on current biopsies 2. Consider abdominal CT scan to look for extragastric pathology such as lymphoma, pancreatic neoplasm, etc. which would admittedly be unlikely in this young patient 3. Could consider a trial of 3 months of high-dose PPI therapy (for example, omeprazole 40 mg twice a day) to help take laryngeal pharyngeal reflux out of the equation, in view of the erythema on her larynx which is a somewhat equivocal finding and does not really correlate with her symptoms    _______________________________ eSigned:  Bernette Redbirdobert Korina Tretter, MD 12/02/2013 9:04 AM    PATIENT NAME:  Marie Gray, Marie G. MR#: [id number]

## 2013-12-03 ENCOUNTER — Encounter (HOSPITAL_COMMUNITY): Payer: Self-pay | Admitting: Gastroenterology

## 2014-05-31 ENCOUNTER — Encounter (HOSPITAL_COMMUNITY): Payer: Self-pay | Admitting: Gastroenterology

## 2014-10-04 ENCOUNTER — Emergency Department (INDEPENDENT_AMBULATORY_CARE_PROVIDER_SITE_OTHER)
Admission: EM | Admit: 2014-10-04 | Discharge: 2014-10-04 | Disposition: A | Discharge status: Home / Self Care | Payer: Self-pay | Source: Home / Self Care | Attending: Emergency Medicine | Admitting: Emergency Medicine

## 2014-10-04 ENCOUNTER — Encounter (HOSPITAL_COMMUNITY): Payer: Self-pay | Admitting: *Deleted

## 2014-10-04 DIAGNOSIS — J111 Influenza due to unidentified influenza virus with other respiratory manifestations: Secondary | ICD-10-CM

## 2014-10-04 DIAGNOSIS — R69 Illness, unspecified: Principal | ICD-10-CM

## 2014-10-04 HISTORY — DX: Calculus of gallbladder without cholecystitis without obstruction: K80.20

## 2014-10-04 LAB — POCT RAPID STREP A: STREPTOCOCCUS, GROUP A SCREEN (DIRECT): NEGATIVE

## 2014-10-04 MED ORDER — OSELTAMIVIR PHOSPHATE 75 MG PO CAPS: 75.0000 mg | ORAL_CAPSULE | Freq: Two times a day (BID) | ORAL | Status: DC

## 2014-10-04 MED ORDER — BENZONATATE 200 MG PO CAPS: 200.0000 mg | ORAL_CAPSULE | Freq: Three times a day (TID) | ORAL | Status: DC | PRN

## 2014-10-04 NOTE — ED Notes (Signed)
C/o chills and fever onset yesterday.  C/o sore throat yesterday, body aches, cough, and runny nose.  No earache.

## 2014-10-04 NOTE — Discharge Instructions (Signed)
Gripe (Influenza) La gripe es una infeccin viral del tracto respiratorio. Ocurre con ms frecuencia en los meses de invierno, ya que las personas pasan ms tiempo en contacto cercano. La gripe puede enfermarlo considerablemente. Se transmite fcilmente de una persona a otra (es contagiosa). CAUSAS  La causa es un virus que infecta el tracto respiratorio. Puede contagiarse el virus al aspirar las gotitas que una persona infectada elimina al toser o estornudar. Tambin puede contagiarse al tocar algo que fue recientemente contaminado con el virus y luego llevarse la mano a la boca, la nariz o los ojos. RIESGOS Y COMPLICACIONES Tendr mayor riesgo de sufrir un resfro grave si consume cigarrillos, es diabtico, sufre una enfermedad cardaca (como insuficiencia cardaca) o pulmonar crnica (como asma) o si tiene debilitado el sistema inmunolgico. Los ancianos y las mujeres embarazadas tienen ms riesgo de sufrir infecciones graves. El problema ms frecuente de la gripe es la infeccin pulmonar (neumona). En algunos casos, este problema puede requerir atencin mdica de emergencia y poner en peligro la vida. SIGNOS Y SNTOMAS  Los sntomas pueden durar entre 4 y 10 das y pueden ser:  Fiebre.  Escalofros.  Dolor de cabeza, dolores en el cuerpo y musculares.  Dolor de garganta.  Molestias en el pecho y tos.  Prdida del apetito.  Debilidad o cansancio.  Mareos.  Nuseas o vmitos. DIAGNSTICO  El diagnstico se realiza segn su historia clnica y un examen fsico. Es necesario realizar un anlisis de cultivo farngeo o nasal para confirmar el diagnstico. TRATAMIENTO  En los casos leves, la gripe se cura sin tratamiento. El tratamiento est dirigido a aliviar los sntomas. En los casos ms graves, el mdico podr recetar medicamentos antivirales para acortar el curso de la enfermedad. Los antibiticos no son eficaces, ya que la infeccin est causada por un virus y no una  bacteria. INSTRUCCIONES PARA EL CUIDADO EN EL HOGAR  Tome los medicamentos solamente como se lo haya indicado el mdico.  Utilice un humidificador de niebla fra para facilitar la respiracin.  Haga reposo hasta que la temperatura vuelva a ser normal. Generalmente esto lleva entre 3 y 4 das.  Beba suficiente lquido para mantener la orina clara o de color amarillo plido.  Cbrase la boca y la nariz al toser o estornudar, y lvese las manos muy bien para evitar que se propague el virus.  Qudese en su casa y no concurra al trabajo o a la escuela hasta que la fiebre haya desaparecido al menos por un da completo. PREVENCIN  La vacunacin anual contra la gripe es la mejor manera de evitar enfermarse. Se recomienda ahora de manera rutinaria una vacuna anual contra la gripe a todos los adultos estadounidenses. SOLICITE ATENCIN MDICA SI:  Tiene dolor en el pecho, la tos empeora o tiene ms mucosidad.  Tiene nuseas, vmitos o diarrea.  La fiebre regresa o empeora. SOLICITE ATENCIN MDICA DE INMEDIATO SI:   Tiene dificultad para respirar, le falta el aire o tiene la piel o las uas azuladas.  Presenta dolor intenso o entumecimiento en el cuello.  Le duele la cabeza de forma repentina o tiene dolor en la cara o el odo.  Tiene nuseas o vmitos que no puede controlar. ASEGRESE DE QUE:   Comprende estas instrucciones.  Controlar su afeccin.  Recibir ayuda de inmediato si no mejora o si empeora. Document Released: 04/25/2005 Document Revised: 11/30/2013 ExitCare Patient Information 2015 [employer]. This information is not intended to replace advice given to you by your health care   provider. Make sure you discuss any questions you have with your health care provider.  

## 2014-10-04 NOTE — ED Provider Notes (Signed)
   Chief Complaint   Fever   History of Present Illness   Marie Gray is a 34 year old female who's had a two-day history of chills, fever, myalgias, headache, sore throat, cough, chest pain, nasal congestion, rhinorrhea, and nausea.  Review of Systems   Other than as noted above, the patient denies any of the following symptoms: Systemic:  No fevers, chills, sweats, or myalgias. Eye:  No redness or discharge. ENT:  No ear pain, headache, nasal congestion, drainage, sinus pressure, or sore throat. Neck:  No neck pain, stiffness, or swollen glands. Lungs:  No cough, sputum production, hemoptysis, wheezing, chest tightness, shortness of breath or chest pain. GI:  No abdominal pain, nausea, vomiting or diarrhea.  PMFSH   Past medical history, family history, social history, meds, and allergies were reviewed.   Physical exam   Vital signs:  BP 114/84 mmHg  Pulse 101  Temp(Src) 100.3 F (37.9 C) (Oral)  Resp 18  SpO2 96%  LMP 09/08/2014 General:  Alert and oriented.  In no distress.  Skin warm and dry. Eye:  No conjunctival injection or drainage. Lids were normal. ENT:  TMs and canals were normal, without erythema or inflammation.  Nasal mucosa was clear and uncongested, without drainage.  Mucous membranes were moist.  Pharynx was clear with no exudate or drainage.  There were no oral ulcerations or lesions. Neck:  Supple, no adenopathy, tenderness or mass. Lungs:  No respiratory distress.  Lungs were clear to auscultation, without wheezes, rales or rhonchi.  Breath sounds were clear and equal bilaterally.  Heart:  Regular rhythm, without gallops, murmers or rubs. Skin:  Clear, warm, and dry, without rash or lesions.  Labs   Results for orders placed or performed during the hospital encounter of 10/04/14  POCT rapid strep A [facility]  Result Value Ref Range   Streptococcus, Group A Screen (Direct) NEGATIVE NEGATIVE    Assessment     The encounter  diagnosis was Influenza-like illness.  There is no evidence of pneumonia, strep throat, sinusitis, otitis media.    Plan    1.  Meds:  The following meds were prescribed:   Discharge Medication List as of 10/04/2014  9:31 PM    START taking these medications   Details  benzonatate (TESSALON) 200 MG capsule Take 1 capsule (200 mg total) by mouth 3 (three) times daily as needed for cough., Starting 10/04/2014, Until Discontinued, Normal    oseltamivir (TAMIFLU) 75 MG capsule Take 1 capsule (75 mg total) by mouth every 12 (twelve) hours., Starting 10/04/2014, Until Discontinued, Normal        2.  Patient Education/Counseling:  The patient was given appropriate handouts, self care instructions, and instructed in symptomatic relief.  Instructed to get extra fluids and extra rest.    3.  Follow up:  The patient was told to follow up here if no better in 3 to 4 days, or sooner if becoming worse in any way, and given some red flag symptoms such as increasing fever, difficulty breathing, chest pain, or persistent vomiting which would prompt immediate return.       Reuben Likesavid C Danzig Macgregor, MD 10/04/14 [id number]

## 2014-10-07 LAB — CULTURE, GROUP A STREP: Strep A Culture: NEGATIVE

## 2018-09-22 ENCOUNTER — Other Ambulatory Visit: Payer: Self-pay | Admitting: Medical

## 2018-09-22 DIAGNOSIS — Z124 Encounter for screening for malignant neoplasm of cervix: Secondary | ICD-10-CM

## 2018-09-22 NOTE — Addendum Note (Signed)
Addended by: [name] on: 09/22/2018 08:37 PM   Modules accepted: Orders

## 2018-09-22 NOTE — Progress Notes (Signed)
Patient: Marie Gray           Date of Birth: 1980/10/31           MRN: [id number] Visit Date: 09/22/2018 PCP: Julieanne Manson, MD     Cervical Exam  Abnormal Observations: No abnormal findings.  Recommendations: Await pap results. Return as indicated      Patient's History Patient Active Problem List   Diagnosis Date Noted  . Other malaise and fatigue 06/06/2011  . Premenstrual tension syndrome 10/24/2010  . Cesarean section scar endometrioma s/p excision on 10/02/13 10/24/2010  . OBESITY, UNSPECIFIED 11/15/2009  . METRORRHAGIA 11/23/2008  . MIGRAINE UNSP W/O INTRACT W/O STATUS MIGRAINOSUS 10/19/2008  . DEPRESSIVE DISORDER NOT ELSEWHERE CLASSIFIED 09/07/2008  . BELL'S PALSY, RIGHT 08/02/2008  . COSTOCHONDRITIS 03/10/2008  . History of gestational diabetes 02/06/2007  . ANXIETY 06/12/2006   Past Medical History:  Diagnosis Date  . Depression   . Gall stones   . GERD (gastroesophageal reflux disease)    pyloric stenosis    Family History  Problem Relation Age of Onset  . Diabetes Mother   . Hypertension Mother   . Diabetes Father   . Diabetes Maternal Uncle   . Heart disease Paternal Grandfather     Past Surgical History:  Procedure Laterality Date  . CESAREAN SECTION  2001, 2008   x 2  . ESOPHAGOGASTRODUODENOSCOPY N/A 12/02/2013   Procedure: ESOPHAGOGASTRODUODENOSCOPY (EGD);  Surgeon: Florencia Reasons, MD;  Location: [facility];  Service: Endoscopy;  Laterality: N/A;  . LIPOMA EXCISION N/A 09/30/2013   Procedure: Excision of  Cesarean section scar lesion ;  Surgeon: Tereso Newcomer, MD;  Location: [facility];  Service: Gynecology;  Laterality: N/A;   Social History   Occupational History  . Not on file  Tobacco Use  . Smoking status: Never Smoker  . Smokeless tobacco: Never Used  Substance and Sexual Activity  . Alcohol use: No  . Drug use: No  . Sexual activity: Yes    Birth control/protection: Condom

## 2018-09-25 LAB — CYTOLOGY - PAP
Adequacy: ABSENT
Diagnosis: NEGATIVE

## 2018-11-03 ENCOUNTER — Encounter (HOSPITAL_COMMUNITY): Payer: Self-pay

## 2019-07-27 ENCOUNTER — Ambulatory Visit: Payer: HRSA Program | Attending: Internal Medicine

## 2019-07-27 DIAGNOSIS — Z20828 Contact with and (suspected) exposure to other viral communicable diseases: Secondary | ICD-10-CM | POA: Insufficient documentation

## 2019-07-27 DIAGNOSIS — Z20822 Contact with and (suspected) exposure to covid-19: Secondary | ICD-10-CM

## 2019-07-28 ENCOUNTER — Other Ambulatory Visit: Payer: Self-pay

## 2019-07-28 LAB — NOVEL CORONAVIRUS, NAA: SARS-CoV-2, NAA: NOT DETECTED

## 2020-12-21 ENCOUNTER — Other Ambulatory Visit: Payer: Self-pay | Admitting: Obstetrics and Gynecology

## 2020-12-21 DIAGNOSIS — Z1231 Encounter for screening mammogram for malignant neoplasm of breast: Secondary | ICD-10-CM

## 2021-01-19 ENCOUNTER — Ambulatory Visit: Payer: No Typology Code available for payment source | Admitting: *Deleted

## 2021-01-19 ENCOUNTER — Other Ambulatory Visit: Payer: Self-pay

## 2021-01-19 ENCOUNTER — Ambulatory Visit
Admission: RE | Admit: 2021-01-19 | Discharge: 2021-01-19 | Disposition: A | Discharge status: Home / Self Care | Payer: No Typology Code available for payment source | Source: Ambulatory Visit | Attending: Obstetrics and Gynecology | Admitting: Obstetrics and Gynecology

## 2021-01-19 VITALS — BP 120/74 | Wt 136.8 lb

## 2021-01-19 DIAGNOSIS — Z1231 Encounter for screening mammogram for malignant neoplasm of breast: Secondary | ICD-10-CM

## 2021-01-19 DIAGNOSIS — Z1239 Encounter for other screening for malignant neoplasm of breast: Secondary | ICD-10-CM

## 2021-01-19 NOTE — Patient Instructions (Signed)
Explained breast self awareness with Marie Gray. Patient did not need a Pap smear today due to last Pap smear was in 09/22/2018. Let her know [facility] will cover Pap smears every 3 years unless has a history of abnormal Pap smears. Referred patient to the [facility] for a screening mammogram. Appointment scheduled Thursday, January 19, 2021 at 1000. Patient escorted to the mobile unit following B[facility] appointment for her screening mammogram. Let patient know the Breast Center will follow up with her within the next couple weeks with results of her mammogram by letter or phone. Marie Gray verbalized understanding.  Lillianah Swartzentruber, Kathaleen Maser, RN 9:12 AM

## 2021-01-19 NOTE — Progress Notes (Addendum)
[name] is a 40 y.o. female who presents to [facility] clinic today with no complaints.    Pap Smear: Pap smear not completed today. Last Pap smear was 09/22/2018 at the free cervical cancer screening clinic and was normal. Per patient has no history of an abnormal Pap smear. Last Pap smear result is available in Epic.   Physical exam: Breasts Right breast slightly larger than left breast that per patient is normal for her. No skin abnormalities bilateral breasts. No nipple retraction bilateral breasts. No nipple discharge bilateral breasts. No lymphadenopathy. No lumps palpated bilateral breasts. No complaints of pain or tenderness on exam.      Pelvic/Bimanual Pap is not indicated today per B[facility]CP guidelines.    Smoking History: Patient has never smoked.   Patient Navigation: Patient education provided. Access to services provided for patient through [place] [place] program. Spanish interpreter [name] from [facility] provided.    Breast and Cervical Cancer Risk Assessment: Patient does not have family history of breast cancer, known genetic mutations, or radiation treatment to the chest before age 43. Patient does not have history of cervical dysplasia, immunocompromised, or DES exposure in-utero.  Risk Assessment     Risk Scores       01/19/2021   Last edited by: [name], LPN   5-year risk: 0.3 %   Lifetime risk: 6.4 %            A: [facility]CP exam without pap smear No complaints.  P: Referred patient to the [facility] for a screening mammogram. Appointment scheduled Thursday, January 19, 2021 at 1000.  [name], RN 01/19/2021 9:12 AM

## 2021-12-26 ENCOUNTER — Other Ambulatory Visit: Payer: Self-pay | Admitting: Obstetrics and Gynecology

## 2021-12-26 DIAGNOSIS — Z1231 Encounter for screening mammogram for malignant neoplasm of breast: Secondary | ICD-10-CM

## 2022-02-08 ENCOUNTER — Ambulatory Visit: Payer: No Typology Code available for payment source

## 2022-03-01 ENCOUNTER — Ambulatory Visit
Admission: RE | Admit: 2022-03-01 | Discharge: 2022-03-01 | Disposition: A | Discharge status: Home / Self Care | Payer: No Typology Code available for payment source | Source: Ambulatory Visit | Attending: Obstetrics and Gynecology | Admitting: Obstetrics and Gynecology

## 2022-03-01 ENCOUNTER — Ambulatory Visit: Payer: Self-pay | Admitting: *Deleted

## 2022-03-01 VITALS — BP 124/80 | Wt 148.2 lb

## 2022-03-01 DIAGNOSIS — Z1231 Encounter for screening mammogram for malignant neoplasm of breast: Secondary | ICD-10-CM

## 2022-03-01 DIAGNOSIS — Z1239 Encounter for other screening for malignant neoplasm of breast: Secondary | ICD-10-CM

## 2022-03-01 NOTE — Progress Notes (Signed)
[name] is a 41 y.o. female who presents to [facility] clinic today with no complaints.    Pap Smear: Pap smear not completed today. Last Pap smear was 09/22/2018 at the free cervical cancer screening clinic and was normal. Per patient has no history of an abnormal Pap smear. Last Pap smear result is available in Epic.   Physical exam: Breasts Right breast slightly larger than left breast that per patient is normal for her. No skin abnormalities bilateral breasts. No nipple retraction bilateral breasts. No nipple discharge bilateral breasts. No lymphadenopathy. No lumps palpated bilateral breasts. No complaints of pain or tenderness on exam.      MS DIGITAL SCREENING TOMO BILATERAL  Result Date: 01/21/2021 CLINICAL DATA:  Screening. EXAM: DIGITAL SCREENING BILATERAL MAMMOGRAM WITH TOMOSYNTHESIS AND CAD TECHNIQUE: Bilateral screening digital craniocaudal and mediolateral oblique mammograms were obtained. Bilateral screening digital breast tomosynthesis was performed. The images were evaluated with computer-aided detection. COMPARISON:  None. ACR Breast Density Category c: The breast tissue is heterogeneously dense, which may obscure small masses FINDINGS: There are no findings suspicious for malignancy. IMPRESSION: No mammographic evidence of malignancy. A result letter of this screening mammogram will be mailed directly to the patient. RECOMMENDATION: Screening mammogram in one year. (Code:SM-B-01Y) BI-RADS CATEGORY  1: Negative. Electronically Signed   By: Marie Gray M.D.   On: 01/21/2021 13:00       Pelvic/Bimanual Pap smear is due. Patient is currently on menstrual period. Pap smear scheduled at the free cervical cancer screening on Thursday, May 24, 2022 at 1130.   Smoking History: Patient has never smoked.   Patient Navigation: Patient education provided. Access to services provided for patient through [employer] program. Spanish interpreter Marie Gray from [facility] provided.  Patient has food insecurities. Patient escorted to the food market at the [employer] for groceries.   Breast and Cervical Cancer Risk Assessment: Patient does not have family history of breast cancer, known genetic mutations, or radiation treatment to the chest before age 66. Patient does not have history of cervical dysplasia, immunocompromised, or DES exposure in-utero.  Risk Assessment     Risk Scores       03/01/2022 01/19/2021   Last edited by: Marie Rutherford, LPN Gray, Marie Demetrius Charity, LPN   5-year risk: 0.4 % 0.3 %   Lifetime risk: 6.3 % 6.4 %            A: B[facility]CP exam without pap smear No complaints.  P: Referred patient to the [facility] for a screening mammogram on mobile unit. Appointment scheduled Thursday, March 01, 2022 at 1130.  Marie Heidelberg, RN 03/01/2022 10:53 AM

## 2022-03-01 NOTE — Patient Instructions (Signed)
Explained breast self awareness with Marie Gray. Pap smear is due. Patient is currently on menstrual period. Pap smear scheduled at the free cervical cancer screening on Thursday, May 24, 2022 at 1130. Let her know [facility] will cover Pap smears every 3 years unless has a history of abnormal Pap smears. Referred patient to the [facility] for a screening mammogram on mobile unit. Appointment scheduled Thursday, March 01, 2022 at 1130. Patient aware of appointment and will be there. Let patient know the Breast Center will follow up with her within the next couple weeks with results of the mammogram by letter or phone. Marie Gray verbalized understanding.  Marie Gray, Marie Maser, RN 10:53 AM

## 2022-03-05 ENCOUNTER — Other Ambulatory Visit: Payer: Self-pay | Admitting: Obstetrics and Gynecology

## 2022-03-05 DIAGNOSIS — R928 Other abnormal and inconclusive findings on diagnostic imaging of breast: Secondary | ICD-10-CM

## 2022-03-08 IMAGING — MG MM DIGITAL SCREENING BILAT W/ TOMO AND CAD
8 series · 9 of 24 positions shown · non-contrast
Comparison: None.

CLINICAL DATA: Screening.

EXAM:
DIGITAL SCREENING BILATERAL MAMMOGRAM WITH TOMOSYNTHESIS AND CAD
TECHNIQUE: Bilateral screening digital craniocaudal and mediolateral oblique
mammograms were obtained. Bilateral screening digital breast
tomosynthesis was performed. The images were evaluated with
computer-aided detection.

[R MLO synth-2D]
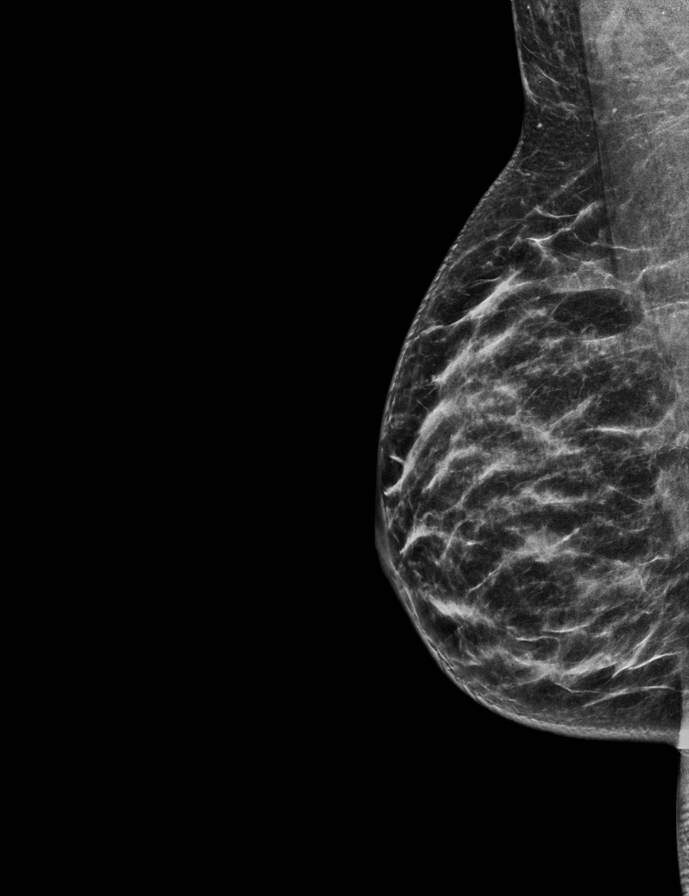

[R CC synth-2D]
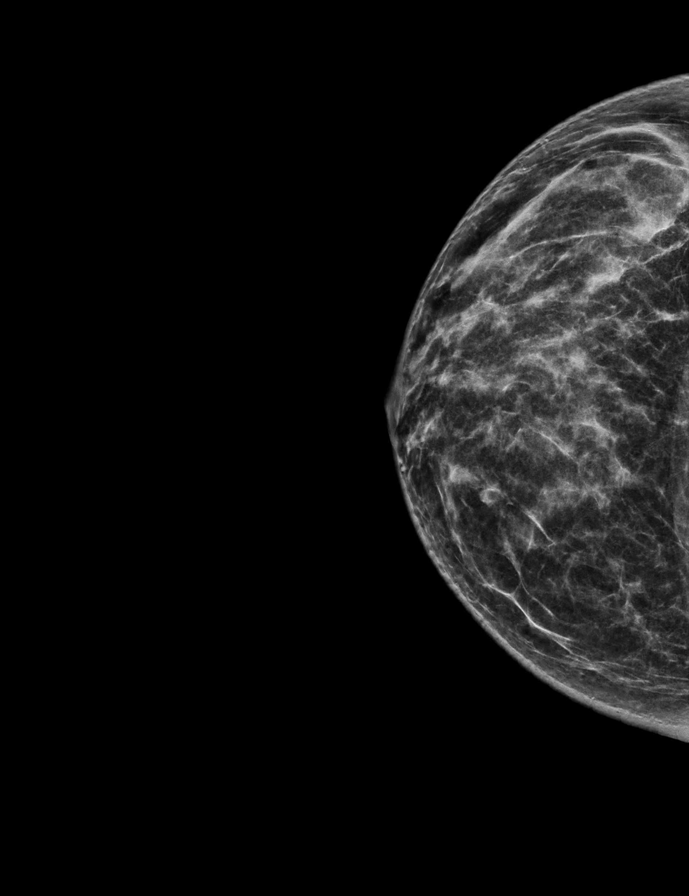

[L MLO synth-2D]
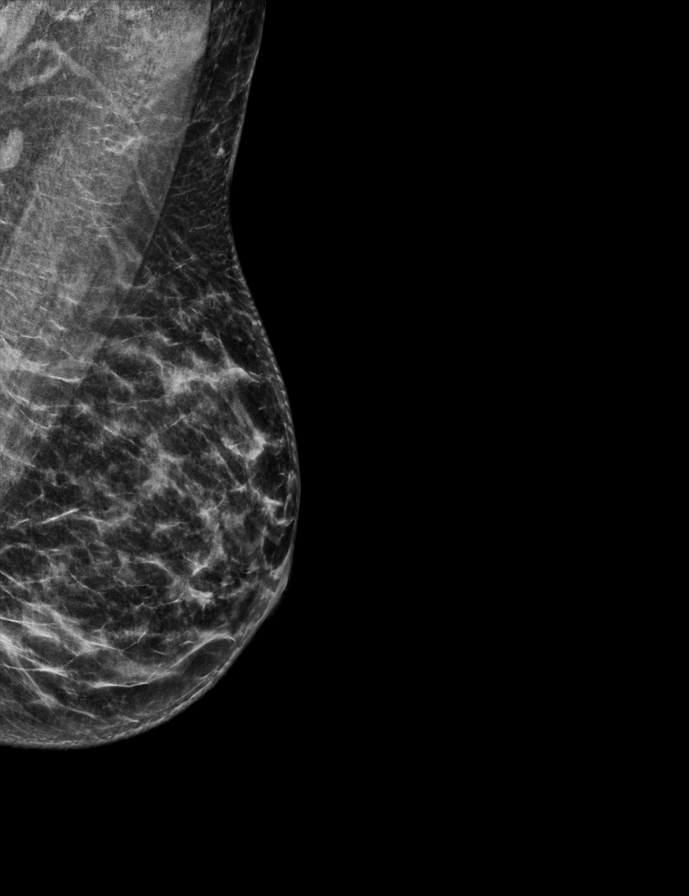

[L CC synth-2D]
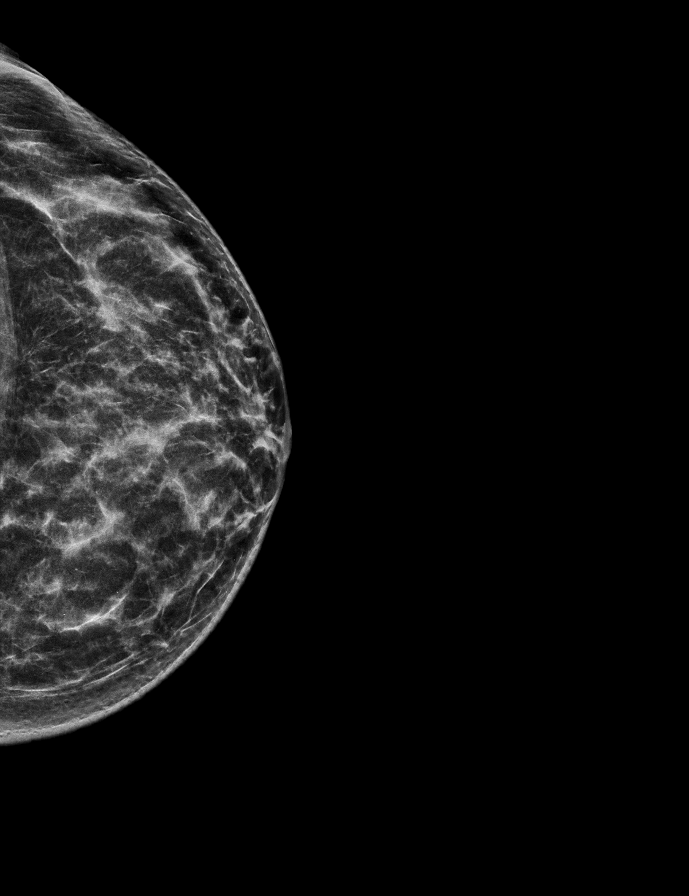

[L MLO tomo · 2 of 57 frames shown]
[frame 19/57]
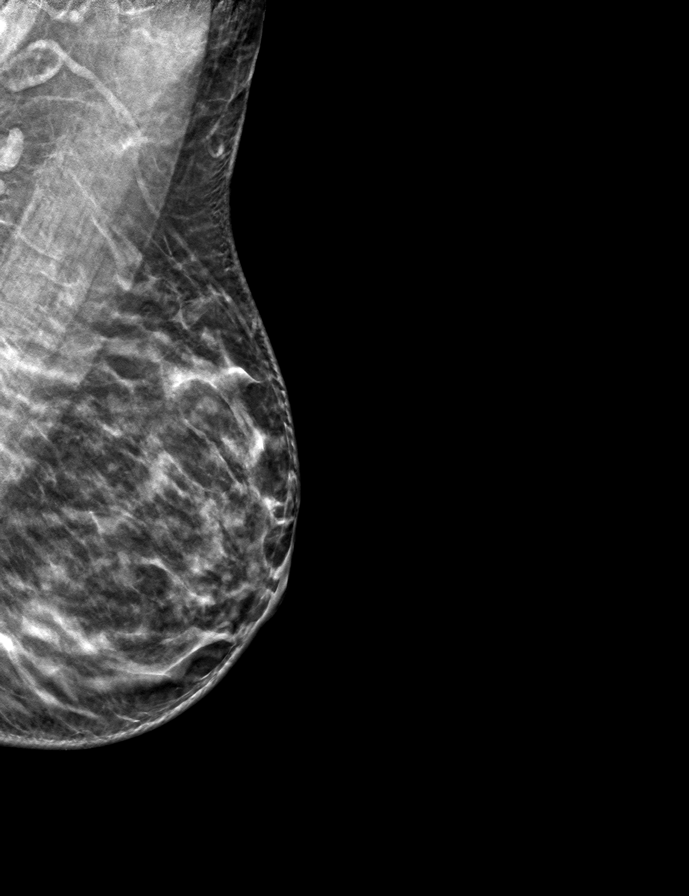
[frame 29/57]
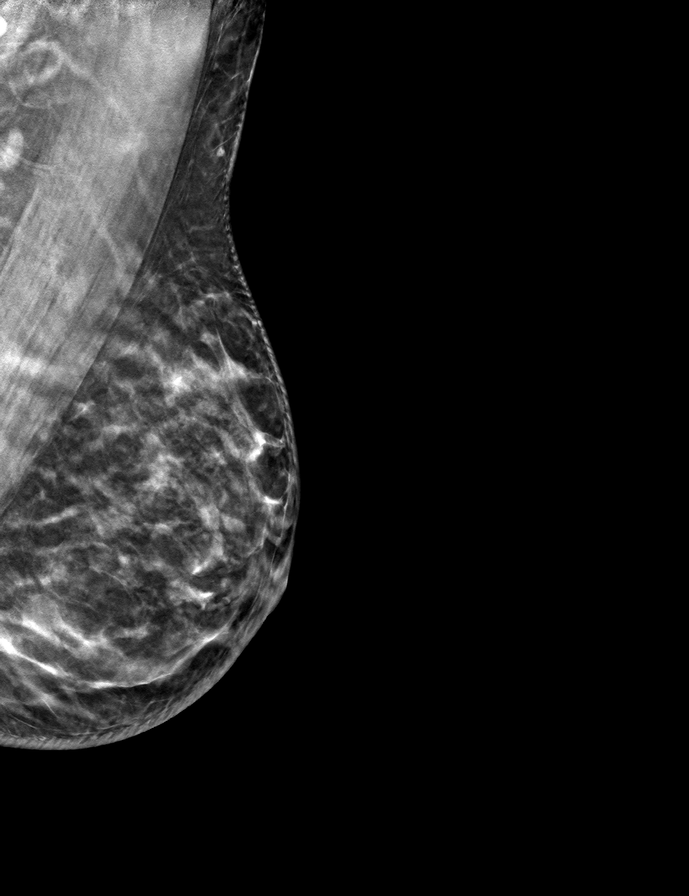

[R MLO tomo · tomo slice 27/54.0]
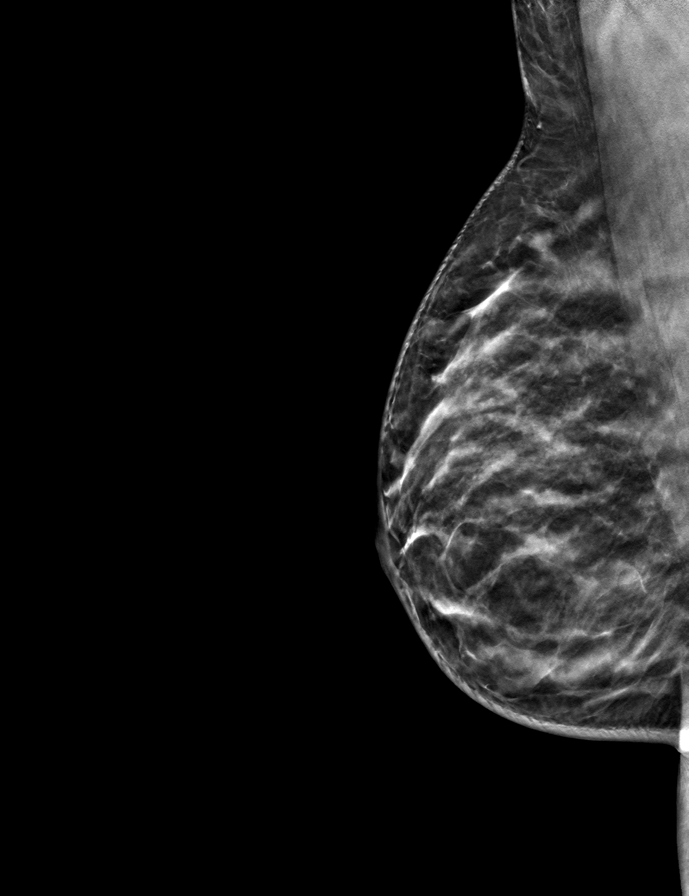

[L CC tomo · tomo slice 25/50.0]
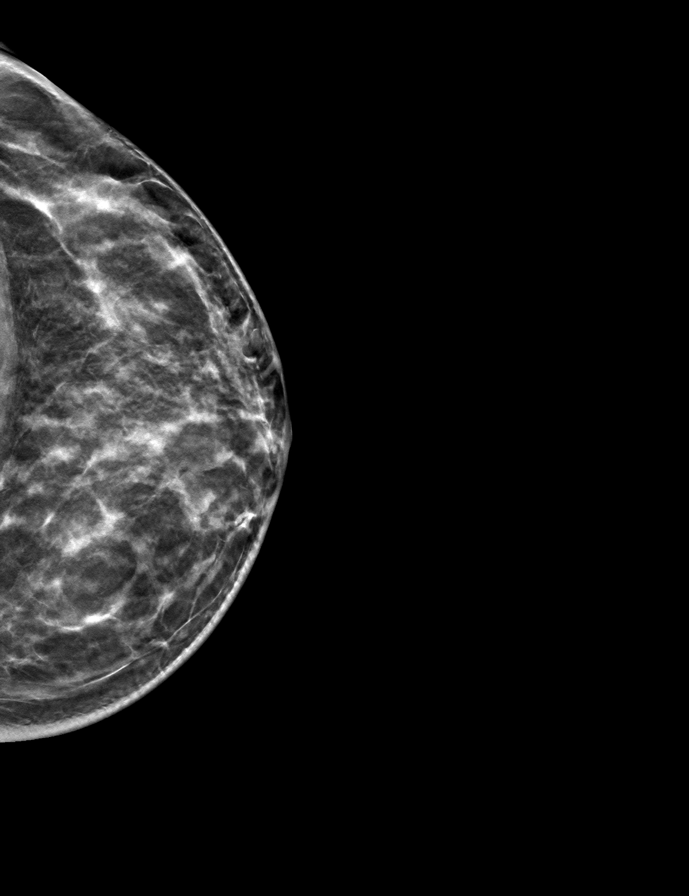

[R CC tomo · tomo slice 27/52.0]
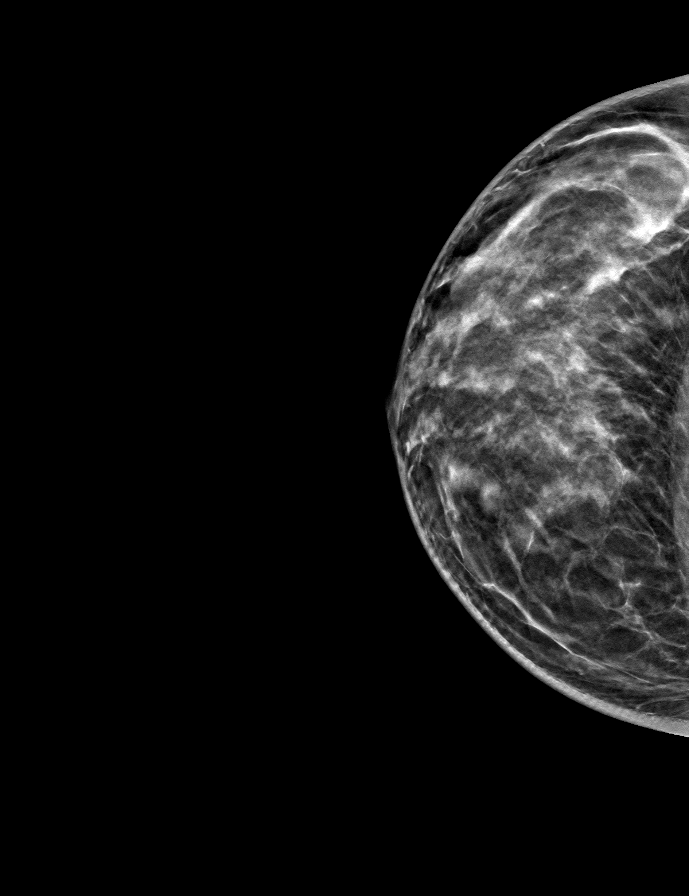

[9 of 24 positions shown; findings below may reference images not displayed]

ACR Breast Density Category c: The breast tissue is heterogeneously
dense, which may obscure small masses
FINDINGS: There are no findings suspicious for malignancy.
IMPRESSION: No mammographic evidence of malignancy. A result letter of this
screening mammogram will be mailed directly to the patient.

RECOMMENDATION:
Screening mammogram in one year. (Code:C8-T-HNK)

BI-RADS CATEGORY  1: Negative.

## 2022-03-13 ENCOUNTER — Ambulatory Visit
Admission: RE | Admit: 2022-03-13 | Discharge: 2022-03-13 | Disposition: A | Discharge status: Home / Self Care | Payer: No Typology Code available for payment source | Source: Ambulatory Visit | Attending: Obstetrics and Gynecology | Admitting: Obstetrics and Gynecology

## 2022-03-13 ENCOUNTER — Other Ambulatory Visit: Payer: Self-pay | Admitting: Obstetrics and Gynecology

## 2022-03-13 DIAGNOSIS — N6310 Unspecified lump in the right breast, unspecified quadrant: Secondary | ICD-10-CM

## 2022-03-13 DIAGNOSIS — R928 Other abnormal and inconclusive findings on diagnostic imaging of breast: Secondary | ICD-10-CM

## 2022-05-24 ENCOUNTER — Other Ambulatory Visit: Payer: Self-pay | Admitting: *Deleted

## 2022-05-24 DIAGNOSIS — Z124 Encounter for screening for malignant neoplasm of cervix: Secondary | ICD-10-CM

## 2022-05-24 NOTE — Progress Notes (Signed)
Patient: Marie Gray           Date of Birth: 01/14/1981           MRN: [id number] Visit Date: 05/24/2022 PCP: Pcp, No  Cervical Cancer Screening Do you smoke?: No Have you ever had or been told you have an allergy to latex products?: No Marital status: Married Date of last pap smear: 2-5 yrs ago (09/22/2018) Date of last menstrual period:  (September 2023) Number of pregnancies: 3 Number of births: 2 Have you ever had any of the following? Hysterectomy: No Tubal ligation (tubes tied): No Abnormal bleeding: No Abnormal pap smear: No Venereal warts: No A sex partner with venereal warts: No A high risk* sex partner: No  Cervical Exam  Abnormal Observations: Thick white yeast appearing discharge observed on exam. Wet prep completed. Recommendations: Last Pap smear was 09/22/2018 at the free cervical cancer screening clinic and was normal. Per patient has no history of an abnormal Pap smear. Last Pap smear result is available in Epic. Let patient know if today's Pap smear is normal and HPV negative that her next Pap smear is due in one year. Informed patient that will follow up with her within the next week with results of her wet prep and Pap smear by phone. Patient verbalized understanding.    Spanish interpreter [name] from [facility] provided.  Patient's History Patient Active Problem List   Diagnosis Date Noted   Other malaise and fatigue 06/06/2011   Premenstrual tension syndrome 10/24/2010   Cesarean section scar endometrioma s/p excision on 10/02/13 10/24/2010   OBESITY, UNSPECIFIED 11/15/2009   METRORRHAGIA 11/23/2008   MIGRAINE UNSP W/O INTRACT W/O STATUS MIGRAINOSUS 10/19/2008   DEPRESSIVE DISORDER NOT ELSEWHERE CLASSIFIED 09/07/2008   BELL'S PALSY, RIGHT 08/02/2008   COSTOCHONDRITIS 03/10/2008   History of gestational diabetes 02/06/2007   ANXIETY 06/12/2006   Past Medical History:  Diagnosis Date   Depression    Gall stones    GERD (gastroesophageal  reflux disease)    pyloric stenosis    Family History  Problem Relation Age of Onset   Diabetes Mother    Hypertension Mother    Diabetes Father    Diabetes Brother    Diabetes Maternal Uncle    Heart disease Paternal Grandfather     Social History   Occupational History   Not on file  Tobacco Use   Smoking status: Never   Smokeless tobacco: Never  Vaping Use   Vaping Use: Never used  Substance and Sexual Activity   Alcohol use: No   Drug use: Never   Sexual activity: Yes    Birth control/protection: Condom

## 2022-05-25 LAB — CERVICOVAGINAL ANCILLARY ONLY
Bacterial Vaginitis (gardnerella): NEGATIVE
Candida Glabrata: NEGATIVE
Candida Vaginitis: NEGATIVE
Comment: NEGATIVE
Comment: NEGATIVE
Comment: NEGATIVE
Comment: NEGATIVE
Trichomonas: NEGATIVE

## 2022-05-25 LAB — CYTOLOGY - PAP
Adequacy: ABSENT
Comment: NEGATIVE
Diagnosis: NEGATIVE
High risk HPV: NEGATIVE

## 2022-05-29 ENCOUNTER — Telehealth: Payer: Self-pay

## 2022-05-29 NOTE — Telephone Encounter (Addendum)
Called patient via Marie Gray, [employer] to give pap smear results. Informed patient that pap smear was normal and HPV was negative. Based on this result her next pap smear will be due in 3 years. Informed patient that wet prep was also negative. Patient voiced understanding.

## 2022-09-17 ENCOUNTER — Ambulatory Visit
Admission: RE | Admit: 2022-09-17 | Discharge: 2022-09-17 | Disposition: A | Discharge status: Home / Self Care | Payer: No Typology Code available for payment source | Source: Ambulatory Visit | Attending: Obstetrics and Gynecology | Admitting: Obstetrics and Gynecology

## 2022-09-17 DIAGNOSIS — N6310 Unspecified lump in the right breast, unspecified quadrant: Secondary | ICD-10-CM

## 2022-10-12 ENCOUNTER — Other Ambulatory Visit: Payer: Self-pay

## 2022-10-12 NOTE — Progress Notes (Signed)
Opened in error. Adonis Huguenin RN

## 2022-10-15 ENCOUNTER — Other Ambulatory Visit: Payer: Self-pay

## 2022-10-15 ENCOUNTER — Inpatient Hospital Stay: Payer: Self-pay | Attending: Obstetrics and Gynecology | Admitting: *Deleted

## 2022-10-15 VITALS — BP 124/80 | Ht <= 58 in | Wt 142.6 lb

## 2022-10-15 DIAGNOSIS — Z1231 Encounter for screening mammogram for malignant neoplasm of breast: Secondary | ICD-10-CM

## 2022-10-15 DIAGNOSIS — Z0000 Encounter for general adult medical examination without abnormal findings: Secondary | ICD-10-CM

## 2022-10-15 NOTE — Progress Notes (Signed)
[facility] initial screening   Marie Gray, Marie Gray   Clinical Measurement:  Vitals:   10/15/22 0933  BP: 122/78   Fasting Labs Drawn Today, will review with patient when they result.   Medical History: Patient states that she does not know if she has high cholesterol, does not know if she has high blood pressure and she does not know if she has diabetes.  Medications: Patient states that she does not take medication to lower cholesterol, blood pressure or blood sugar.  Patient does not take an aspirin a day to help prevent a heart attack or stroke.   Blood pressure, self measurement: Patient states that she does not have the equipment to measure blood pressure from home. She checks her blood pressure N/A. She shares her readings with a health care provider: N/A.   Nutrition: Patient states that on average she eats 3 cups of fruit and 3 cups of vegetables per day. Patient states that she does not eat fish at least 2 times per week. Patient eats more than half servings of whole grains. Patient drinks less than 36 ounces of beverages with added sugar weekly: yes. Patient is currently watching sodium or salt intake: yes. In the past 7 days patient has consumed drinks containing alcohol on 0 days. On a day that patient consumes drinks containing alcohol on average 0 drinks are consumed.      Physical activity: Patient states that she gets 0 minutes of moderate and 0 minutes of vigorous physical activity each week.  Smoking status: Patient states that she has has never smoked .   Quality of life: Over the past 2 weeks patient states that she had little interest or pleasure in doing things: not at all. She has been feeling down, depressed or hopeless:not at all.   Social Determinants of Health Assessment:   Computer Use: During the last 12 months patient states that she has used any of the following: desktop/laptop, smart phone or tablet/other portable wireless computer: yes.    Internet Use: During the last 12 months, did you or any member of your household have access to the internet: Yes, by paying a cell phone company or internet service provider.   Food Insecurities: During the last 12 months, where there any times when you were worried that you would run out of food because of a lack of money or other resources: Yes.   Transportation Barriers: During the last 12 months, have you missed a doctor's appointment because of transportation problems: No.   Childcare Barriers: If you are currently using childcare services, please identify  the type of services you use. (If not using childcare services, please select "Not applicable"): not applicable. During the last 12 months, have you had any barriers to childcare services such as: not applicable.   Housing: What is your housing situation today: I have housing.   Intimate Partner Violence: During the last 12 months, how often did your partner physically hurt you: never. During the last 12 months, how often did your partner insult you or talk down to you: never.  Medication Adherence: During the last 12 months, did you ever forget to take your medicine: not applicable. During the last 12 months, were you careless ar times about taking your medicine: not applicable. During the last 12 months, when you felt better did you sometimes stop taking your medication: not applicable. During the last 12 months, sometimes if you felt worse when you took your medicine did you stop taking it:  not applicable.   Risk reduction and counseling:   Health Coaching: Patient consumes 3-4 servings of fruit daily and 3 servings of vegetables daily. Patient consumes tuna at times but not regularly. Encouraged patient to try and start adding heart healthy fish into her weekly diet if possible. Gave patient the recommendations of tuna, salmon, mackerel, sardines, sea bass or trout. Patient eats a variety of whole grains regularly including (bread,  oatmeal, brown rice and whole grain pasta). Patient consumes on average 3 cups of sweetened beverages each week. Explained to patient that the recommendation is for 36 ounces or less each week. Patient watches sodium intake. Patient has not been exercising recently but wishes to start back. Patient has set a goal related to exercise and fitness, see below.   Goal: Patient would like to start exercising. Patient would like to start walking 5 days a week for 30 minutes. Patient will work on reaching this goal over the next month.   Navigation:  I will notify patient of lab results.  Patient is aware of 2 more health coaching sessions and a follow up.  Time: 25 minutes

## 2022-10-16 LAB — HEMOGLOBIN A1C
Est. average glucose Bld gHb Est-mCnc: 120 mg/dL
Hgb A1c MFr Bld: 5.8 % — ABNORMAL HIGH (ref 4.8–5.6)

## 2022-10-16 LAB — GLUCOSE, RANDOM: Glucose: 95 mg/dL (ref 70–99)

## 2022-10-16 LAB — LIPID PANEL
Chol/HDL Ratio: 3.2 ratio (ref 0.0–4.4)
Cholesterol, Total: 165 mg/dL (ref 100–199)
HDL: 52 mg/dL (ref >39)
LDL Chol Calc (NIH): 99 mg/dL (ref 0–99)
Triglycerides: 76 mg/dL (ref 0–149)
VLDL Cholesterol Cal: 14 mg/dL (ref 5–40)

## 2022-10-22 ENCOUNTER — Telehealth: Payer: Self-pay

## 2022-10-22 NOTE — Telephone Encounter (Signed)
Health coaching 2   interpreter- [name], [place] cholesterol, 99 LDL cholesterol, 76 triglycerides, 52 HDL cholesterol, 5.8 hemoglobin A1C, 95 mean plasma glucose.  Patient understands and is aware of her lab results.   Goals-  1. Reduce the amount of fried and fatty foods consumed. 2. Increase daily exercise to 20-30 minutes. 3. Watch the amount of sweet and sugary food and drinks consumed.  4. Decrease the amount of carbohydrate rich foods consumed.    Navigation:  Patient is aware of 1 more health coaching sessions and a follow up.   Time- 10 minutes

## 2022-11-08 ENCOUNTER — Encounter: Payer: No Typology Code available for payment source | Admitting: Student

## 2022-12-31 ENCOUNTER — Other Ambulatory Visit: Payer: Self-pay

## 2022-12-31 DIAGNOSIS — N6310 Unspecified lump in the right breast, unspecified quadrant: Secondary | ICD-10-CM

## 2022-12-31 NOTE — Addendum Note (Signed)
Addended by: [name] on: 12/31/2022 03:31 PM   Modules accepted: Orders

## 2023-03-06 ENCOUNTER — Ambulatory Visit (INDEPENDENT_AMBULATORY_CARE_PROVIDER_SITE_OTHER): Payer: Self-pay | Admitting: Family Medicine

## 2023-03-06 ENCOUNTER — Encounter (HOSPITAL_BASED_OUTPATIENT_CLINIC_OR_DEPARTMENT_OTHER): Payer: Self-pay | Admitting: Family Medicine

## 2023-03-06 ENCOUNTER — Ambulatory Visit: Payer: Self-pay | Admitting: General Surgery

## 2023-03-06 VITALS — BP 127/71 | HR 73 | Ht <= 58 in | Wt 136.0 lb

## 2023-03-06 DIAGNOSIS — R1013 Epigastric pain: Secondary | ICD-10-CM

## 2023-03-06 DIAGNOSIS — Z7689 Persons encountering health services in other specified circumstances: Secondary | ICD-10-CM

## 2023-03-06 NOTE — Progress Notes (Unsigned)
   New Patient Office Visit  Subjective    Patient ID: Marie Gray, female    DOB: July 01, 1981  Age: 42 y.o. MRN: [id number]  CC:  Chief Complaint  Patient presents with   GI Problem    Gastritis ongoing for about 2 months, patient has had H Pylori in the past thinks she has it again. Eating/drinking small amounts make her feel full. Has been using pepto bismol for 2 weeks. Feels nauseous all the time, can eat oatmeal, boiled chicken/vegetables   HPI Marie Gray is a 42 year-old female who presents to establish care. She has concerns about gastritis. She reports about 10 years ago, she was diagnosed with H. pylori about 10 years ago.   2 months- pain in stomach, lots of gas, greasy or oily reflux is worsened N/V? Only nausea constantly  She is able to consume food Pain- epigastric region- hurts when she touches it. Denies burning sensation.   Eats & drinks water- nauseous x2 months   7/22: Diagnosed recently with cholecystitis- pepto bismol does help   Scheduled 8/26    Outpatient Encounter Medications as of 03/06/2023  Medication Sig   bismuth subsalicylate (PEPTO-BISMOL TO-GO) 262 MG chewable tablet Chew 524 mg by mouth as needed.   Multiple Vitamin (MULTIVITAMIN) tablet Take 1 tablet by mouth daily.   No facility-administered encounter medications on file as of 03/06/2023.    Past Medical History:  Diagnosis Date   Depression    Gall stones    GERD (gastroesophageal reflux disease)    pyloric stenosis    Past Surgical History:  Procedure Laterality Date   CESAREAN SECTION  2001, 2008   x 2   ESOPHAGOGASTRODUODENOSCOPY N/A 12/02/2013   Procedure: ESOPHAGOGASTRODUODENOSCOPY (EGD);  Surgeon: Florencia Reasons, MD;  Location: [facility];  Service: Endoscopy;  Laterality: N/A;   LIPOMA EXCISION N/A 09/30/2013   Procedure: Excision of  Cesarean section scar lesion ;  Surgeon: Tereso Newcomer, MD;  Location: [facility];  Service: Gynecology;  Laterality: N/A;     Family History  Problem Relation Age of Onset   Diabetes Mother    Hypertension Mother    Diabetes Father    Diabetes Brother    Diabetes Maternal Uncle    Heart disease Paternal Grandfather      ROS      Objective    BP 127/71   Pulse 73   Ht 4\' 8"  (1.422 m)   Wt 136 lb (61.7 kg)   SpO2 100%   BMI 30.49 kg/m   Physical Exam      Assessment & Plan:   ***  Return in about 3 months (around 06/06/2023) for Physical with fasting labs.   Alyson Reedy, FNP

## 2023-03-07 DIAGNOSIS — R1013 Epigastric pain: Secondary | ICD-10-CM | POA: Insufficient documentation

## 2023-03-08 ENCOUNTER — Encounter (HOSPITAL_BASED_OUTPATIENT_CLINIC_OR_DEPARTMENT_OTHER): Payer: Self-pay | Admitting: Emergency Medicine

## 2023-03-08 ENCOUNTER — Emergency Department (HOSPITAL_BASED_OUTPATIENT_CLINIC_OR_DEPARTMENT_OTHER)
Admission: EM | Admit: 2023-03-08 | Discharge: 2023-03-08 | Disposition: A | Discharge status: Home / Self Care | Payer: Self-pay | Attending: Emergency Medicine | Admitting: Emergency Medicine

## 2023-03-08 ENCOUNTER — Other Ambulatory Visit: Payer: Self-pay

## 2023-03-08 ENCOUNTER — Telehealth (HOSPITAL_BASED_OUTPATIENT_CLINIC_OR_DEPARTMENT_OTHER): Payer: Self-pay | Admitting: Family Medicine

## 2023-03-08 DIAGNOSIS — R0789 Other chest pain: Secondary | ICD-10-CM | POA: Insufficient documentation

## 2023-03-08 DIAGNOSIS — R110 Nausea: Secondary | ICD-10-CM | POA: Insufficient documentation

## 2023-03-08 DIAGNOSIS — G4700 Insomnia, unspecified: Secondary | ICD-10-CM | POA: Insufficient documentation

## 2023-03-08 DIAGNOSIS — F419 Anxiety disorder, unspecified: Secondary | ICD-10-CM | POA: Insufficient documentation

## 2023-03-08 MED ORDER — HYDROXYZINE HCL 25 MG PO TABS: 25.0000 mg | ORAL_TABLET | Freq: Four times a day (QID) | ORAL | 0 refills | Status: DC | PRN

## 2023-03-08 NOTE — ED Triage Notes (Addendum)
Pt arrives to ED with c/o on-going anxiety. She notes she stopped talking her medicine last year. She notes over the past two week she has had a hard time sleeping. Pt notes she would like to be started back on her medicine.

## 2023-03-08 NOTE — ED Provider Notes (Signed)
[place] EMERGENCY DEPARTMENT AT [facility] Provider Note   CSN: [id number] Arrival date & time: 03/08/23  1154     History  Chief Complaint  Patient presents with   Anxiety    Marie Gray is a 42 y.o. female with a past medical history of anxiety, gallstones, who presents emergency department with concerns for anxiety.  Notes that she used to take sertraline and lorazepam for her symptoms.  Has not had this medication in over a year.  Had a new patient appointment with her primary care doctor on 03/06/2023.  She also notes an episode of chest tightness last night.  Also notes increased insomnia secondary to anxiety.  Has associated nausea.  No meds tried at home. Denies shortness of breath, vomiting. Denies PMHx of MI, DM, HTN, CAD, family history of MI in someone younger than age 29, stents. Pt denies recent travel, immobilization, surgery, anticoagulant use, estrogen use, birth control use, or PMHx of PE/DVT.     The history is provided by the patient. No language interpreter was used.       Home Medications Prior to Admission medications   Medication Sig Start Date End Date Taking? Authorizing Provider  hydrOXYzine (ATARAX) 25 MG tablet Take 1 tablet (25 mg total) by mouth every 6 (six) hours as needed for anxiety. 03/08/23  Yes Atlantis Delong A, PA-C  bismuth subsalicylate (PEPTO-BISMOL TO-GO) 262 MG chewable tablet Chew 524 mg by mouth as needed.    [provider]  Multiple Vitamin (MULTIVITAMIN) tablet Take 1 tablet by mouth daily.    [provider]      Allergies    Patient has no known allergies.    Review of Systems   Review of Systems  All other systems reviewed and are negative.   Physical Exam Updated Vital Signs BP 138/84 (BP Location: Right Arm)   Pulse 73   Temp 98.2 F (36.8 C) (Oral)   Resp 19   SpO2 100%  Physical Exam Vitals and nursing note reviewed.  Constitutional:      General: She is not in acute  distress.    Appearance: Normal appearance.  Eyes:     General: No scleral icterus.    Extraocular Movements: Extraocular movements intact.  Cardiovascular:     Rate and Rhythm: Normal rate and regular rhythm.     Pulses: Normal pulses.     Heart sounds: Normal heart sounds.  Pulmonary:     Effort: Pulmonary effort is normal. No respiratory distress.     Breath sounds: Normal breath sounds.  Abdominal:     Palpations: Abdomen is soft. There is no mass.     Tenderness: There is no abdominal tenderness.  Musculoskeletal:        General: Normal range of motion.     Cervical back: Neck supple.  Skin:    General: Skin is warm and dry.     Findings: No rash.  Neurological:     Mental Status: She is alert.     Sensory: Sensation is intact.     Motor: Motor function is intact.  Psychiatric:        Behavior: Behavior normal.     ED Results / Procedures / Treatments   Labs (all labs ordered are listed, but only abnormal results are displayed) Labs Reviewed - No data to display  EKG None  Radiology No results found.  Procedures Procedures   Medications Ordered in ED Medications - No data to display  ED Course/  Medical Decision Making/ A&P Clinical Course as of 03/08/23 1252  Fri Mar 08, 2023  1244 Offered EKG and chest pain workup, patient declines at this time and notes that she would like to wait until her appointment with her PCP as scheduled for 03/12/23 [SB]    Clinical Course User Index [SB] Deana Krock A, PA-C                                 Medical Decision Making Risk Prescription drug management.   Pt presents with anxiety.  History of similar symptoms and previously treated with sertraline and lorazepam over a year ago. Vital signs, patient afebrile, not tachycardic or hypoxic. On exam, pt with no acute cardiovascular, respiratory, abdominal exam findings.   Co morbidities that complicate the patient evaluation: Anxiety, gallstones  Additional  history obtained:  External records from outside source obtained and reviewed including: Patient was evaluated for new patient appointment on 03/06/2023.   Disposition: Presentation suspicious for anxiety.  Offered patient chest pain workup today in the emergency department due to her episode of chest pain last night.  Patient declines at this time and notes that she will have this followed with her primary care provider.  Denies episode of chest pain today.  No cardiac history.  No PE risk factors at this time.  After consideration of the diagnostic results and the patients response to treatment, I feel that the patient would benefit from Discharge home.  Patient will be sent a prescription for Atarax and informed to maintain her scheduled appoint with her primary care provider on 03/12/2023.  Supportive care measures and strict return precautions discussed with patient at bedside. Pt acknowledges and verbalizes understanding. Pt appears safe for discharge. Follow up as indicated in discharge paperwork.    This chart was dictated using voice recognition software, Dragon. Despite the best efforts of this provider to proofread and correct errors, errors may still occur which can change documentation meaning.  Final Clinical Impression(s) / ED Diagnoses Final diagnoses:  Anxiety    Rx / DC Orders ED Discharge Orders          Ordered    hydrOXYzine (ATARAX) 25 MG tablet  Every 6 hours PRN        03/08/23 1247              Ashanti Ratti A, PA-C 03/08/23 1252    Benjiman Core, MD 03/08/23 1555

## 2023-03-08 NOTE — Telephone Encounter (Signed)
Patients friend delia asked for patient to be seen for anxiety today. Provider not in the office and provider in office full. Jerre Simon advised patient to go to ER or UC for symptoms on panic. Advised friend Delia of this.

## 2023-03-08 NOTE — ED Notes (Signed)
Patient verbalizes understanding of discharge instructions. Opportunity for questioning and answers were provided. Patient discharged from ED.  °

## 2023-03-08 NOTE — Discharge Instructions (Addendum)
It was a pleasure taking care of you today!  You were offered a chest pain workup today in the ED to which you declined and opted for evaluation of your symptoms with your primary care provider appointment as scheduled on 03/12/23. You will be sent a prescription for Hydroxyzine, take as directed. This medication may cause sleepiness. Do not drive or operate heavy machinery while taking this medication. Return to the ED if you are experiencing increasing/worsening symptoms.   Fue un placer cuidarte hoy!  Le ofrecieron un examen de dolor en el pecho hoy en el servicio de urgencias, el cual rechaz y opt por una evaluacin de sus sntomas con su cita con su proveedor de atencin primaria programada para el 13/08/24. Se le enviar una receta de hidroxizina; tmela segn las indicaciones. Este medicamento puede causar somnolencia. No conduzca ni opere maquinaria pesada mientras toma este medicamento. Regrese al servicio de urgencias si experimenta sntomas que aumentan o empeoran.

## 2023-03-11 ENCOUNTER — Telehealth (HOSPITAL_BASED_OUTPATIENT_CLINIC_OR_DEPARTMENT_OTHER): Payer: Self-pay | Admitting: Family Medicine

## 2023-03-11 ENCOUNTER — Encounter (HOSPITAL_BASED_OUTPATIENT_CLINIC_OR_DEPARTMENT_OTHER): Payer: Self-pay | Admitting: Family Medicine

## 2023-03-11 NOTE — Telephone Encounter (Signed)
Pt is calling to get results of labs

## 2023-03-12 ENCOUNTER — Encounter (HOSPITAL_BASED_OUTPATIENT_CLINIC_OR_DEPARTMENT_OTHER): Payer: Self-pay | Admitting: Family Medicine

## 2023-03-12 ENCOUNTER — Ambulatory Visit (INDEPENDENT_AMBULATORY_CARE_PROVIDER_SITE_OTHER): Payer: Self-pay | Admitting: Family Medicine

## 2023-03-12 VITALS — BP 127/76 | HR 80 | Ht <= 58 in | Wt 136.6 lb

## 2023-03-12 DIAGNOSIS — K219 Gastro-esophageal reflux disease without esophagitis: Secondary | ICD-10-CM | POA: Insufficient documentation

## 2023-03-12 DIAGNOSIS — F419 Anxiety disorder, unspecified: Secondary | ICD-10-CM

## 2023-03-12 MED ORDER — SERTRALINE HCL 50 MG PO TABS: 50.0000 mg | ORAL_TABLET | Freq: Every day | ORAL | 2 refills | Status: DC

## 2023-03-12 MED ORDER — PANTOPRAZOLE SODIUM 20 MG PO TBEC
20.0000 mg | DELAYED_RELEASE_TABLET | Freq: Two times a day (BID) | ORAL | 2 refills | Status: DC
Start: 2023-03-12 — End: 2023-03-21

## 2023-03-12 MED ORDER — HYDROXYZINE HCL 50 MG PO TABS
50.0000 mg | ORAL_TABLET | Freq: Three times a day (TID) | ORAL | 2 refills | Status: DC | PRN
Start: 2023-03-12 — End: 2023-04-10

## 2023-03-12 NOTE — Assessment & Plan Note (Signed)
Patient was diagnosed with anxiety about 14+ years ago and has been on sertraline 50mg  daily in the past. She decided to stop taking it for the past few months, since her mood had improved, but now she has noticed a significant increase in anxiety- causing issues with her sleep schedule. Denies SI/HI. GAD7 score completed with score of 11; PHQ9 completed with score of 17. Discussed options, patient would like to proceed with restarting sertraline. Counseled patient to take 25mg  daily for 2 weeks, and then she can increase to 50mg  tablet after tolerating the lowest dose. Discussed possible medication side effects, adverse side effects, and that medication may take 4-6 weeks to notice an improvement in mood. She verbalized an understanding of these instructions We also discussed use of 50mg  hydroxyzine for panic and/or insomnia.

## 2023-03-12 NOTE — Progress Notes (Signed)
Established Patient Office Visit  Subjective   Patient ID: Marie Gray, female    DOB: July 25, 1981  Age: 42 y.o. MRN: [id number]  Chief Complaint  Patient presents with   Anxiety    Pt is here to discuss problems with anxiety. Pt states she has been having problems for about a month and a half which is bad enough that she is having problems sleeping at night.   Marie Gray is a 42 year-old female patient who presents today for concerns regarding anxiety and issues with sleep.   ANXIETY: Friday came to urgent care felt very bad- "I could not sleep," she reports having chest pressure, legs were shaking, and "confusion" in her head. ED- no imaging or labs done. Prescribed her Hydroxyzine 25mg  She reports that she has taken sertraline for about 14 years in the past for anxiety- she felt good and wanted to stop taking it so she has been off of it for an unknown amount of time- possibly a few months.   INSOMNIA:  Duration:  prior to being diagnosed with anxiety, about 14+ years ago  Average hours of sleep: 4-5 hours  Difficulty falling asleep: yes Difficulty staying asleep: yes Good sleep hygiene: yes Treatments tried: melatonin, magnesium, and hydroxyzine  Change in life/stressors: loss of loved one & concerns about H pylori test she had done last week  Recent stress: yes Daytime sleepiness: no, she reports frequent exercise to help with her mind Apnea: no Snoring: no History of sleep study: no      03/12/2023    2:43 PM  GAD 7 : Generalized Anxiety Score  Nervous, Anxious, on Edge 3  Control/stop worrying 2  Worry too much - different things 3  Trouble relaxing 1  Restless 1  Easily annoyed or irritable 0  Afraid - awful might happen 1  Total GAD 7 Score 11  Anxiety Difficulty Not difficult at all       03/12/2023    2:40 PM 10/15/2022    9:45 AM  Depression screen PHQ 2/9  Decreased Interest 1 0  Down, Depressed, Hopeless 3 0  PHQ - 2 Score 4 0   Altered sleeping 3   Tired, decreased energy 3   Change in appetite 3   Feeling bad or failure about yourself  3   Trouble concentrating 1   Moving slowly or fidgety/restless 0   Suicidal thoughts 0   PHQ-9 Score 17   Difficult doing work/chores Somewhat difficult      Review of Systems  Constitutional:  Negative for malaise/fatigue.  Eyes:  Negative for blurred vision and double vision.  Respiratory:  Negative for cough and shortness of breath.   Cardiovascular:  Negative for chest pain, palpitations and leg swelling.  Gastrointestinal:  Positive for abdominal pain, heartburn and nausea (patient has known gallstones- surgery planned for 8/26). Negative for vomiting.  Genitourinary:  Negative for flank pain, frequency, hematuria and urgency.  Neurological:  Negative for dizziness, speech change and weakness.  Psychiatric/Behavioral:  Negative for depression, substance abuse and suicidal ideas. The patient is nervous/anxious and has insomnia.      Objective:    BP 127/76 (BP Location: Right Arm, Patient Position: Sitting, Cuff Size: Normal)   Pulse 80   Ht 4\' 8"  (1.422 m)   Wt 136 lb 9.6 oz (62 kg)   LMP 03/08/2023 (Exact Date)   SpO2 99%   BMI 30.63 kg/m  BP Readings from Last 3 Encounters:  03/12/23 127/76  03/08/23 128/69  03/06/23 127/71    Physical Exam Constitutional:      Appearance: Normal appearance.  Cardiovascular:     Rate and Rhythm: Normal rate and regular rhythm.     Pulses: Normal pulses.     Heart sounds: Normal heart sounds.  Pulmonary:     Effort: Pulmonary effort is normal.     Breath sounds: Normal breath sounds.  Neurological:     Mental Status: She is alert.  Psychiatric:        Mood and Affect: Mood normal.        Behavior: Behavior normal.        Thought Content: Thought content normal.        Judgment: Judgment normal.     Assessment & Plan:  Anxiety Assessment & Plan: Patient was diagnosed with anxiety about 14+ years ago and has  been on sertraline 50mg  daily in the past. She decided to stop taking it for the past few months, since her mood had improved, but now she has noticed a significant increase in anxiety- causing issues with her sleep schedule. Denies SI/HI. GAD7 score completed with score of 11; PHQ9 completed with score of 17. Discussed options, patient would like to proceed with restarting sertraline. Counseled patient to take 25mg  daily for 2 weeks, and then she can increase to 50mg  tablet after tolerating the lowest dose. Discussed possible medication side effects, adverse side effects, and that medication may take 4-6 weeks to notice an improvement in mood. She verbalized an understanding of these instructions We also discussed use of 50mg  hydroxyzine for panic and/or insomnia.   Orders: -     Sertraline HCl; Take 1 tablet (50 mg total) by mouth daily. Take 0.5 tablet (25 mg total) by mouth for 2 weeks. Take 1 tablet (50 mg total) by mouth for additional 2 weeks.  Dispense: 30 tablet; Refill: 2 -     hydrOXYzine HCl; Take 1 tablet (50 mg total) by mouth every 8 (eight) hours as needed for anxiety.  Dispense: 30 tablet; Refill: 2  Gastroesophageal reflux disease, unspecified whether esophagitis present Assessment & Plan: Patient continues to experience epigastric pain and nausea- most likely related to current cholelithiasis. Recent H. pylori was negative. Patient is scheduled for cholecystectomy on 03/25/2023 and counseled patient that this should relieve her symptoms. In the meantime, counseled patient that we could try PPI medication to help with burning sensation/pain in the epigastric region and see if this helps improve her nausea for the next couple of weeks, until she has the surgery.   Orders: -     Pantoprazole Sodium; Take 1 tablet (20 mg total) by mouth 2 (two) times daily.  Dispense: 60 tablet; Refill: 2     Return in about 4 weeks (around 04/09/2023) for Mood f/u.    Marie Reedy, FNP

## 2023-03-12 NOTE — Assessment & Plan Note (Addendum)
Patient continues to experience epigastric pain and nausea- most likely related to current cholelithiasis. Recent H. pylori was negative. Patient is scheduled for cholecystectomy on 03/25/2023 and counseled patient that this should relieve her symptoms. In the meantime, counseled patient that we could try PPI medication to help with burning sensation/pain in the epigastric region and see if this helps improve her nausea for the next couple of weeks, until she has the surgery.

## 2023-03-21 ENCOUNTER — Ambulatory Visit: Payer: Self-pay | Admitting: Hematology and Oncology

## 2023-03-21 ENCOUNTER — Ambulatory Visit
Admission: RE | Admit: 2023-03-21 | Discharge: 2023-03-21 | Disposition: A | Discharge status: Home / Self Care | Payer: Self-pay | Source: Ambulatory Visit | Attending: Obstetrics and Gynecology | Admitting: Obstetrics and Gynecology

## 2023-03-21 ENCOUNTER — Ambulatory Visit
Admission: RE | Admit: 2023-03-21 | Discharge: 2023-03-21 | Disposition: A | Discharge status: Home / Self Care | Payer: No Typology Code available for payment source | Source: Ambulatory Visit | Attending: Obstetrics and Gynecology | Admitting: Obstetrics and Gynecology

## 2023-03-21 VITALS — BP 125/76 | Wt 132.0 lb

## 2023-03-21 DIAGNOSIS — N6310 Unspecified lump in the right breast, unspecified quadrant: Secondary | ICD-10-CM

## 2023-03-21 NOTE — Patient Instructions (Signed)
Taught Marie Gray about self breast awareness and gave educational materials to take home. Patient did not need a Pap smear today due to last Pap smear was in 05/24/22 per patient.  Let her know [facility] will cover Pap smears every 5 years unless has a history of abnormal Pap smears. Referred patient to the [facility] for diagnostic mammogram. Appointment scheduled for 03/21/23. Patient aware of appointment and will be there. Let patient know will follow up with her within the next couple weeks with results. Danilee Levie verbalized understanding.  Pascal Lux, NP 9:01 AM

## 2023-03-21 NOTE — Progress Notes (Signed)
[name] is a 42 y.o. female who presents to [facility] today with no complaints. Follow up of possible asymmetry in the right breast.    Pap Smear: Pap not smear completed today. Last Pap smear was 05/24/22 and was normal. Per patient has no history of an abnormal Pap smear. Last Pap smear result is available in Epic.   Physical exam: Breasts Breasts symmetrical. No skin abnormalities bilateral breasts. No nipple retraction bilateral breasts. No nipple discharge bilateral breasts. No lymphadenopathy. No lumps palpated bilateral breasts.   MM DIAG BREAST TOMO UNI RIGHT  Result Date: 03/13/2022 CLINICAL DATA:  The patient was called back from screening mammography due to a right breast mass. EXAM: ULTRASOUND RIGHT BREAST LIMITED; DIGITAL DIAGNOSTIC UNILATERAL RIGHT MAMMOGRAM WITH TOMOSYNTHESIS TECHNIQUE: Targeted ultrasound examination of the right breast was performed; Right digital diagnostic mammography and breast tomosynthesis was performed. COMPARISON:  Previous exam(s). ACR Breast Density Category c: The breast tissue is heterogeneously dense, which may obscure small masses. FINDINGS: The right mass persists on today's imaging. Targeted ultrasound is performed, showing a complicated cyst deep in the right breast at 7 o'clock, 3 cm from the nipple correlating with the mammographic finding. The mass measures 8 x 3 x 7 mm and demonstrates septations but no obvious solid components. IMPRESSION: The mammographically identified mass appears to be a cluster of cysts. RECOMMENDATION: Recommend six-month follow-up ultrasound of the cluster of cysts on the right. I have discussed the findings and recommendations with the patient. If applicable, a reminder letter will be sent to the patient regarding the next appointment. BI-RADS CATEGORY  3: Probably benign. Electronically Signed   By: Gerome Sam III M.D.   On: 03/13/2022 10:54  MS DIGITAL SCREENING TOMO BILATERAL  Result Date:  03/02/2022 CLINICAL DATA:  Screening. EXAM: DIGITAL SCREENING BILATERAL MAMMOGRAM WITH TOMOSYNTHESIS AND CAD TECHNIQUE: Bilateral screening digital craniocaudal and mediolateral oblique mammograms were obtained. Bilateral screening digital breast tomosynthesis was performed. The images were evaluated with computer-aided detection. COMPARISON:  Previous exam(s). ACR Breast Density Category c: The breast tissue is heterogeneously dense, which may obscure small masses. FINDINGS: In the right breast, a possible asymmetry warrants further evaluation. In the left breast, no findings suspicious for malignancy. IMPRESSION: Further evaluation is suggested for possible asymmetry in the right breast. RECOMMENDATION: Diagnostic mammogram and possibly ultrasound of the right breast. (Code:[id number]-54M) The patient will be contacted regarding the findings, and additional imaging will be scheduled. BI-RADS CATEGORY  0: Incomplete. Need additional imaging evaluation and/or prior mammograms for comparison. Electronically Signed   By: Ted Mcalpine M.D.   On: 03/02/2022 16:25   MS DIGITAL SCREENING TOMO BILATERAL  Result Date: 01/21/2021 CLINICAL DATA:  Screening. EXAM: DIGITAL SCREENING BILATERAL MAMMOGRAM WITH TOMOSYNTHESIS AND CAD TECHNIQUE: Bilateral screening digital craniocaudal and mediolateral oblique mammograms were obtained. Bilateral screening digital breast tomosynthesis was performed. The images were evaluated with computer-aided detection. COMPARISON:  None. ACR Breast Density Category c: The breast tissue is heterogeneously dense, which may obscure small masses FINDINGS: There are no findings suspicious for malignancy. IMPRESSION: No mammographic evidence of malignancy. A result letter of this screening mammogram will be mailed directly to the patient. RECOMMENDATION: Screening mammogram in one year. (Code:[id number]) BI-RADS CATEGORY  1: Negative. Electronically Signed   By: Harmon Pier M.D.   On: 01/21/2021 13:00         Pelvic/Bimanual Pap is not indicated today    Smoking History: Patient has never smoked and was not referred to quit line.  Patient Navigation: Patient education provided. Access to services provided for patient through [facility] program. [name] interpreter provided. No transportation provided   Colorectal Cancer Screening: Per patient has never had colonoscopy completed No complaints today.    Breast and Cervical Cancer Risk Assessment: Patient does not have family history of breast cancer, known genetic mutations, or radiation treatment to the chest before age 89. Patient does not have history of cervical dysplasia, immunocompromised, or DES exposure in-utero.  Risk Assessment   No risk assessment data for the current encounter  Risk Scores       03/01/2022   Last edited by: [name], LPN   5-year risk: 0.4%   Lifetime risk: 6.3%            A: B[facility]CP exam without pap smear No complaints with benign exam. Follow up possible asymmetry in right breast.   P: Referred patient to the [facility] for a diagnostic mammogram. Appointment scheduled 03/21/23.  Pascal Lux, NP 03/21/2023 8:59 AM

## 2023-03-22 ENCOUNTER — Encounter (HOSPITAL_COMMUNITY): Payer: Self-pay | Admitting: General Surgery

## 2023-03-22 ENCOUNTER — Other Ambulatory Visit: Payer: Self-pay

## 2023-03-22 NOTE — Progress Notes (Signed)
Spoke with pt for pre-op call via [employer], Marie Gray ([place]). Pt denies cardiac history, HTN or Diabetes.   Shower instructions given to pt.

## 2023-03-25 ENCOUNTER — Other Ambulatory Visit: Payer: Self-pay

## 2023-03-25 ENCOUNTER — Ambulatory Visit (HOSPITAL_COMMUNITY)
Admission: RE | Admit: 2023-03-25 | Discharge: 2023-03-25 | Disposition: A | Discharge status: Home / Self Care | Payer: No Typology Code available for payment source | Attending: General Surgery | Admitting: General Surgery

## 2023-03-25 ENCOUNTER — Encounter (HOSPITAL_COMMUNITY): Payer: Self-pay | Admitting: General Surgery

## 2023-03-25 ENCOUNTER — Ambulatory Visit (HOSPITAL_COMMUNITY): Payer: No Typology Code available for payment source | Admitting: Anesthesiology

## 2023-03-25 ENCOUNTER — Ambulatory Visit (HOSPITAL_BASED_OUTPATIENT_CLINIC_OR_DEPARTMENT_OTHER): Payer: No Typology Code available for payment source | Admitting: Anesthesiology

## 2023-03-25 ENCOUNTER — Encounter (HOSPITAL_COMMUNITY)
Admission: RE | Disposition: A | Discharge status: Home / Self Care | Payer: Self-pay | Source: Home / Self Care | Attending: General Surgery

## 2023-03-25 DIAGNOSIS — K8000 Calculus of gallbladder with acute cholecystitis without obstruction: Secondary | ICD-10-CM

## 2023-03-25 DIAGNOSIS — K8010 Calculus of gallbladder with chronic cholecystitis without obstruction: Secondary | ICD-10-CM | POA: Insufficient documentation

## 2023-03-25 DIAGNOSIS — Z79899 Other long term (current) drug therapy: Secondary | ICD-10-CM | POA: Insufficient documentation

## 2023-03-25 DIAGNOSIS — K219 Gastro-esophageal reflux disease without esophagitis: Secondary | ICD-10-CM | POA: Insufficient documentation

## 2023-03-25 HISTORY — PX: CHOLECYSTECTOMY: SHX55

## 2023-03-25 HISTORY — DX: Other specified postprocedural states: Z98.890

## 2023-03-25 HISTORY — DX: Other specified postprocedural states: R11.2

## 2023-03-25 HISTORY — DX: Headache, unspecified: R51.9

## 2023-03-25 LAB — CBC
HCT: 39.4 % (ref 36.0–46.0)
Hemoglobin: 12.9 g/dL (ref 12.0–15.0)
MCH: 29.2 pg (ref 26.0–34.0)
MCHC: 32.7 g/dL (ref 30.0–36.0)
MCV: 89.1 fL (ref 80.0–100.0)
Platelets: 313 10*3/uL (ref 150–400)
RBC: 4.42 MIL/uL (ref 3.87–5.11)
RDW: 12.8 % (ref 11.5–15.5)
WBC: 6.2 10*3/uL (ref 4.0–10.5)
nRBC: 0 % (ref 0.0–0.2)

## 2023-03-25 LAB — POCT PREGNANCY, URINE: Preg Test, Ur: NEGATIVE

## 2023-03-25 SURGERY — LAPAROSCOPIC CHOLECYSTECTOMY
Anesthesia: General | Site: Abdomen

## 2023-03-25 MED ORDER — PROPOFOL 10 MG/ML IV BOLUS
INTRAVENOUS | Status: AC
  Filled 2023-03-25: qty 20

## 2023-03-25 MED ORDER — SCOPOLAMINE 1 MG/3DAYS TD PT72: 1.0000 | MEDICATED_PATCH | TRANSDERMAL | Status: DC

## 2023-03-25 MED ORDER — CHLORHEXIDINE GLUCONATE 0.12 % MT SOLN
15.0000 mL | Freq: Once | OROMUCOSAL | Status: AC
  Administered 2023-03-25: 15 mL via OROMUCOSAL
  Filled 2023-03-25: qty 15

## 2023-03-25 MED ORDER — ORAL CARE MOUTH RINSE: 15.0000 mL | Freq: Once | OROMUCOSAL | Status: AC

## 2023-03-25 MED ORDER — HYDROMORPHONE HCL 1 MG/ML IJ SOLN
INTRAMUSCULAR | Status: AC
  Administered 2023-03-25: 0.5 mg via INTRAVENOUS
  Filled 2023-03-25: qty 1

## 2023-03-25 MED ORDER — PROPOFOL 10 MG/ML IV BOLUS
INTRAVENOUS | Status: DC | PRN
  Administered 2023-03-25: 130 mg via INTRAVENOUS

## 2023-03-25 MED ORDER — MIDAZOLAM HCL 2 MG/2ML IJ SOLN: 0.5000 mg | Freq: Once | INTRAMUSCULAR | Status: DC | PRN

## 2023-03-25 MED ORDER — ENSURE PRE-SURGERY PO LIQD: 296.0000 mL | Freq: Once | ORAL | Status: DC

## 2023-03-25 MED ORDER — PROMETHAZINE HCL 25 MG/ML IJ SOLN
INTRAMUSCULAR | Status: AC
  Administered 2023-03-25: 6.25 mg via INTRAVENOUS
  Filled 2023-03-25: qty 1

## 2023-03-25 MED ORDER — MEPERIDINE HCL 25 MG/ML IJ SOLN: 6.2500 mg | INTRAMUSCULAR | Status: DC | PRN

## 2023-03-25 MED ORDER — LIDOCAINE 2% (20 MG/ML) 5 ML SYRINGE
INTRAMUSCULAR | Status: DC | PRN
  Administered 2023-03-25: 30 mg via INTRAVENOUS

## 2023-03-25 MED ORDER — SUGAMMADEX SODIUM 200 MG/2ML IV SOLN
INTRAVENOUS | Status: DC | PRN
  Administered 2023-03-25: 200 mg via INTRAVENOUS

## 2023-03-25 MED ORDER — OXYCODONE HCL 5 MG PO TABS
ORAL_TABLET | ORAL | Status: AC
  Administered 2023-03-25: 5 mg via ORAL
  Filled 2023-03-25: qty 1

## 2023-03-25 MED ORDER — ROCURONIUM BROMIDE 10 MG/ML (PF) SYRINGE
PREFILLED_SYRINGE | INTRAVENOUS | Status: AC
  Filled 2023-03-25: qty 10

## 2023-03-25 MED ORDER — ONDANSETRON HCL 4 MG/2ML IJ SOLN
INTRAMUSCULAR | Status: DC | PRN
  Administered 2023-03-25: 4 mg via INTRAVENOUS

## 2023-03-25 MED ORDER — SODIUM CHLORIDE 0.9 % IR SOLN
Status: DC | PRN
  Administered 2023-03-25: 1000 mL

## 2023-03-25 MED ORDER — LACTATED RINGERS IV SOLN: INTRAVENOUS | Status: DC

## 2023-03-25 MED ORDER — ONDANSETRON HCL 4 MG/2ML IJ SOLN
INTRAMUSCULAR | Status: AC
  Filled 2023-03-25: qty 2

## 2023-03-25 MED ORDER — ACETAMINOPHEN 500 MG PO TABS
1000.0000 mg | ORAL_TABLET | ORAL | Status: AC
  Administered 2023-03-25: 1000 mg via ORAL
  Filled 2023-03-25: qty 2

## 2023-03-25 MED ORDER — PROMETHAZINE HCL 25 MG/ML IJ SOLN: 6.2500 mg | INTRAMUSCULAR | Status: DC | PRN

## 2023-03-25 MED ORDER — OXYCODONE HCL 5 MG/5ML PO SOLN: 5.0000 mg | Freq: Once | ORAL | Status: AC | PRN

## 2023-03-25 MED ORDER — CHLORHEXIDINE GLUCONATE CLOTH 2 % EX PADS: 6.0000 | MEDICATED_PAD | Freq: Once | CUTANEOUS | Status: DC

## 2023-03-25 MED ORDER — FENTANYL CITRATE (PF) 250 MCG/5ML IJ SOLN
INTRAMUSCULAR | Status: DC | PRN
  Administered 2023-03-25: 150 ug via INTRAVENOUS

## 2023-03-25 MED ORDER — 0.9 % SODIUM CHLORIDE (POUR BTL) OPTIME
TOPICAL | Status: DC | PRN
  Administered 2023-03-25: 1000 mL

## 2023-03-25 MED ORDER — LIDOCAINE 2% (20 MG/ML) 5 ML SYRINGE
INTRAMUSCULAR | Status: AC
  Filled 2023-03-25: qty 5

## 2023-03-25 MED ORDER — BUPIVACAINE HCL 0.25 % IJ SOLN
INTRAMUSCULAR | Status: DC | PRN
  Administered 2023-03-25: 8 mL

## 2023-03-25 MED ORDER — ROCURONIUM BROMIDE 10 MG/ML (PF) SYRINGE
PREFILLED_SYRINGE | INTRAVENOUS | Status: DC | PRN
  Administered 2023-03-25: 60 mg via INTRAVENOUS

## 2023-03-25 MED ORDER — ACETAMINOPHEN 500 MG PO TABS: 1000.0000 mg | ORAL_TABLET | Freq: Once | ORAL | Status: DC

## 2023-03-25 MED ORDER — HYDROMORPHONE HCL 1 MG/ML IJ SOLN
0.2500 mg | INTRAMUSCULAR | Status: DC | PRN
  Administered 2023-03-25: 0.5 mg via INTRAVENOUS

## 2023-03-25 MED ORDER — TRAMADOL HCL 50 MG PO TABS: 50.0000 mg | ORAL_TABLET | Freq: Four times a day (QID) | ORAL | 0 refills | Status: DC | PRN

## 2023-03-25 MED ORDER — DEXAMETHASONE SODIUM PHOSPHATE 10 MG/ML IJ SOLN
INTRAMUSCULAR | Status: AC
  Filled 2023-03-25: qty 1

## 2023-03-25 MED ORDER — BUPIVACAINE-EPINEPHRINE (PF) 0.25% -1:200000 IJ SOLN
INTRAMUSCULAR | Status: AC
  Filled 2023-03-25: qty 30

## 2023-03-25 MED ORDER — OXYCODONE HCL 5 MG PO TABS: 5.0000 mg | ORAL_TABLET | Freq: Once | ORAL | Status: AC | PRN

## 2023-03-25 MED ORDER — CEFAZOLIN SODIUM-DEXTROSE 2-4 GM/100ML-% IV SOLN
2.0000 g | INTRAVENOUS | Status: AC
  Administered 2023-03-25: 2 g via INTRAVENOUS
  Filled 2023-03-25: qty 100

## 2023-03-25 MED ORDER — DEXAMETHASONE SODIUM PHOSPHATE 10 MG/ML IJ SOLN
INTRAMUSCULAR | Status: DC | PRN
  Administered 2023-03-25: 10 mg via INTRAVENOUS

## 2023-03-25 MED ORDER — FENTANYL CITRATE (PF) 250 MCG/5ML IJ SOLN
INTRAMUSCULAR | Status: AC
  Filled 2023-03-25: qty 5

## 2023-03-25 SURGICAL SUPPLY — 42 items
ADH SKN CLS APL DERMABOND .7 (GAUZE/BANDAGES/DRESSINGS) ×1
APL PRP STRL LF DISP 70% ISPRP (MISCELLANEOUS) ×1
BAG COUNTER SPONGE SURGICOUNT (BAG) ×1 IMPLANT
BAG SPEC RTRVL 10 TROC 200 (ENDOMECHANICALS)
BAG SPNG CNTER NS LX DISP (BAG) ×1
CANISTER SUCT 3000ML PPV (MISCELLANEOUS) ×1 IMPLANT
CHLORAPREP W/TINT 26 (MISCELLANEOUS) ×1 IMPLANT
CLIP LIGATING HEMO O LOK GREEN (MISCELLANEOUS) ×1 IMPLANT
COVER SURGICAL LIGHT HANDLE (MISCELLANEOUS) ×1 IMPLANT
COVER TRANSDUCER ULTRASND (DRAPES) ×1 IMPLANT
DERMABOND ADVANCED .7 DNX12 (GAUZE/BANDAGES/DRESSINGS) ×1 IMPLANT
ELECT REM PT RETURN 9FT ADLT (ELECTROSURGICAL) ×1
ELECTRODE REM PT RTRN 9FT ADLT (ELECTROSURGICAL) ×1 IMPLANT
GLOVE BIO SURGEON STRL SZ7.5 (GLOVE) ×2 IMPLANT
GOWN STRL REUS W/ TWL LRG LVL3 (GOWN DISPOSABLE) ×2 IMPLANT
GOWN STRL REUS W/ TWL XL LVL3 (GOWN DISPOSABLE) ×1 IMPLANT
GOWN STRL REUS W/TWL LRG LVL3 (GOWN DISPOSABLE) ×2
GOWN STRL REUS W/TWL XL LVL3 (GOWN DISPOSABLE) ×1
GRASPER SUT TROCAR 14GX15 (MISCELLANEOUS) ×1 IMPLANT
IRRIG SUCT STRYKERFLOW 2 WTIP (MISCELLANEOUS) ×1
IRRIGATION SUCT STRKRFLW 2 WTP (MISCELLANEOUS) ×1 IMPLANT
KIT BASIN OR (CUSTOM PROCEDURE TRAY) ×1 IMPLANT
KIT IMAGING PINPOINTPAQ (MISCELLANEOUS) IMPLANT
KIT TURNOVER KIT B (KITS) ×1 IMPLANT
NDL INSUFFLATION 14GA 120MM (NEEDLE) ×1 IMPLANT
NEEDLE INSUFFLATION 14GA 120MM (NEEDLE) ×1
NS IRRIG 1000ML POUR BTL (IV SOLUTION) ×1 IMPLANT
PAD ARMBOARD 7.5X6 YLW CONV (MISCELLANEOUS) ×1 IMPLANT
POUCH LAPAROSCOPIC INSTRUMENT (MISCELLANEOUS) ×1 IMPLANT
POUCH RETRIEVAL ECOSAC 10 (ENDOMECHANICALS) IMPLANT
SCISSORS LAP 5X35 DISP (ENDOMECHANICALS) ×1 IMPLANT
SET TUBE SMOKE EVAC HIGH FLOW (TUBING) ×1 IMPLANT
SLEEVE Z-THREAD 5X100MM (TROCAR) ×1 IMPLANT
SPECIMEN JAR SMALL (MISCELLANEOUS) ×1 IMPLANT
SUT MNCRL AB 4-0 PS2 18 (SUTURE) ×1 IMPLANT
TOWEL GREEN STERILE (TOWEL DISPOSABLE) ×1 IMPLANT
TOWEL GREEN STERILE FF (TOWEL DISPOSABLE) ×1 IMPLANT
TRAY LAPAROSCOPIC MC (CUSTOM PROCEDURE TRAY) ×1 IMPLANT
TROCAR 11X100 Z THREAD (TROCAR) ×1 IMPLANT
TROCAR Z-THREAD OPTICAL 5X100M (TROCAR) ×1 IMPLANT
WARMER LAPAROSCOPE (MISCELLANEOUS) ×1 IMPLANT
WATER STERILE IRR 1000ML POUR (IV SOLUTION) ×1 IMPLANT

## 2023-03-25 NOTE — Op Note (Signed)
03/25/2023  4:02 PM  PATIENT:  Marie Gray  42 y.o. female  PRE-OPERATIVE DIAGNOSIS:  GALLSTONES  POST-OPERATIVE DIAGNOSIS:  GALLSTONES  PROCEDURE:  Procedure(s): LAPAROSCOPIC CHOLECYSTECTOMY (N/A)  SURGEON:  Surgeons and Role:    Axel Filler, MD - Primary  ANESTHESIA:   local and general  EBL:  minimal   BLOOD ADMINISTERED:none  DRAINS: none   LOCAL MEDICATIONS USED:  BUPIVICAINE   SPECIMEN:  Source of Specimen:  gallbladder  DISPOSITION OF SPECIMEN:  PATHOLOGY  COUNTS:  YES  TOURNIQUET:  * No tourniquets in log *  DICTATION: .Dragon Dictation  EBL: <5cc   Complications: none   Counts: reported as correct x 2   Findings: chronic inflammation of the gallbladder, multiple stones  Indications for procedure: Pt is a 57F with RUQ pain and seen to have gallstones.   Details of the procedure: The patient was taken to the operating and placed in the supine position with bilateral SCDs in place. A time out was called and all facts were verified. A pneumoperitoneum was obtained via A Veress needle technique to a pressure of 14mm of mercury. A 5mm trochar was then placed in the right upper quadrant under visualization, and there were no injuries to any abdominal organs. A 11 mm port was then placed in the umbilical region after infiltrating with local anesthesia under direct visualization. A second epigastric port was placed under direct visualization.   The gallbladder was identified and retracted, the peritoneum was then sharply dissected from the gallbladder and this dissection was carried down to Calot's triangle. The cystic duct was identified and dissected circumferentially and seen going into the gallbladder 360.  The cystic artery was dissected away from the surrounding tissues.   The critical angle was obtained.  2 clips were placed proximally one distally and the cystic duct transected. The cystic artery was identified and 2 clips placed proximally  and one distally and transected. We then proceeded to remove the gallbladder off the hepatic fossa with Bovie cautery. A retrieval bag was then placed in the abdomen and gallbladder placed in the bag. The hepatic fossa was then reexamined and hemostasis was achieved with Bovie cautery and was excellent at this portion of the case. The subhepatic fossa and perihepatic fossa was then irrigated until the effluent was clear. The specimen bag and specimen were removed from the abdominal cavity.  The 11 mm trocar fascia was reapproximated with the Endo Close #1 Vicryl x2. The pneumoperitoneum was evacuated and all trochars removed under direct visulalization. The skin was then closed with 4-0 Monocryl and the skin dressed with Dermabond. The patient was awaken from general anesthesia and taken to the recovery room in stable condition.    PLAN OF CARE: Discharge to home after PACU  PATIENT DISPOSITION:  PACU - hemodynamically stable.   Delay start of Pharmacological VTE agent (>24hrs) due to surgical blood loss or risk of bleeding: not applicable

## 2023-03-25 NOTE — Anesthesia Postprocedure Evaluation (Signed)
Anesthesia Post Note  Patient: Marie Gray  Procedure(s) Performed: LAPAROSCOPIC CHOLECYSTECTOMY (Abdomen)     Patient location during evaluation: PACU Anesthesia Type: General Level of consciousness: awake and alert, oriented and patient cooperative Pain management: pain level controlled Vital Signs Assessment: post-procedure vital signs reviewed and stable Respiratory status: spontaneous breathing, nonlabored ventilation and respiratory function stable Cardiovascular status: blood pressure returned to baseline and stable Postop Assessment: no apparent nausea or vomiting Anesthetic complications: no   No notable events documented.  Last Vitals:  Vitals:   03/25/23 1144 03/25/23 1615  BP: 127/71 119/65  Pulse: 95 82  Resp: 18 17  Temp: 36.8 C 36.6 C  SpO2: 98% 98%    Last Pain:  Vitals:   03/25/23 1645  TempSrc:   PainSc: (P) 6                  Brantley Naser,E. Saiquan Hands

## 2023-03-25 NOTE — Discharge Instructions (Signed)

## 2023-03-25 NOTE — Transfer of Care (Signed)
Immediate Anesthesia Transfer of Care Note  Patient: Marie Gray  Procedure(s) Performed: LAPAROSCOPIC CHOLECYSTECTOMY (Abdomen)  Patient Location: PACU  Anesthesia Type:General  Level of Consciousness: awake, alert , oriented, and patient cooperative  Airway & Oxygen Therapy: Patient Spontanous Breathing  Post-op Assessment: Report given to RN, Post -op Vital signs reviewed and stable, and Patient moving all extremities X 4  Post vital signs: Reviewed and stable  Last Vitals:  Vitals Value Taken Time  BP 119/65 03/25/23 1615  Temp 36.6 C 03/25/23 1615  Pulse 80 03/25/23 1619  Resp 17 03/25/23 1619  SpO2 97 % 03/25/23 1619  Vitals shown include unfiled device data.  Last Pain:  Vitals:   03/25/23 1615  TempSrc:   PainSc: 0-No pain         Complications: No notable events documented.

## 2023-03-25 NOTE — Anesthesia Procedure Notes (Signed)
Procedure Name: Intubation Date/Time: 03/25/2023 3:36 PM  Performed by: Eulah Pont, CRNAPre-anesthesia Checklist: Patient identified, Emergency Drugs available, Suction available and Patient being monitored Patient Re-evaluated:Patient Re-evaluated prior to induction Oxygen Delivery Method: Circle System Utilized Preoxygenation: Pre-oxygenation with 100% oxygen Induction Type: IV induction Ventilation: Mask ventilation without difficulty Laryngoscope Size: Miller and 2 Grade View: Grade II Tube type: Oral Tube size: 6.5 mm Number of attempts: 1 Airway Equipment and Method: Stylet and Oral airway Placement Confirmation: ETT inserted through vocal cords under direct vision, positive ETCO2 and breath sounds checked- equal and bilateral Secured at: 20 cm Tube secured with: Tape Dental Injury: Teeth and Oropharynx as per pre-operative assessment

## 2023-03-25 NOTE — Anesthesia Preprocedure Evaluation (Addendum)
Anesthesia Evaluation  Patient identified by MRN, date of birth, ID band Patient awake    Reviewed: Allergy & Precautions, NPO status , Patient's Chart, lab work & pertinent test results  History of Anesthesia Complications Negative for: history of anesthetic complications  Airway Mallampati: II  TM Distance: >3 FB Neck ROM: Full    Dental  (+) Dental Advisory Given   Pulmonary neg pulmonary ROS   breath sounds clear to auscultation       Cardiovascular negative cardio ROS  Rhythm:Regular Rate:Normal     Neuro/Psych  Headaches  Anxiety Depression       GI/Hepatic Neg liver ROS,GERD  Medicated and Controlled,,  Endo/Other  BMI 29.6  Renal/GU negative Renal ROS     Musculoskeletal   Abdominal   Peds  Hematology negative hematology ROS (+)   Anesthesia Other Findings   Reproductive/Obstetrics                             Anesthesia Physical Anesthesia Plan  ASA: 2  Anesthesia Plan: General   Post-op Pain Management: Tylenol PO (pre-op)*   Induction: Intravenous  PONV Risk Score and Plan: 4 or greater and Ondansetron, Dexamethasone and Scopolamine patch - Pre-op  Airway Management Planned: Oral ETT  Additional Equipment: None  Intra-op Plan:   Post-operative Plan: Extubation in OR  Informed Consent: I have reviewed the patients History and Physical, chart, labs and discussed the procedure including the risks, benefits and alternatives for the proposed anesthesia with the patient or authorized representative who has indicated his/her understanding and acceptance.     Dental advisory given  Plan Discussed with: CRNA and Surgeon  Anesthesia Plan Comments:        Anesthesia Quick Evaluation

## 2023-03-25 NOTE — H&P (Signed)
Subjective   Chief Complaint: New Consultation ( Gallstones,)   History of Present Illness: Marie Gray is a 42 y.o. female who is seen today as an office consultation at the request of Dr. Toney Reil for evaluation of New Consultation ( Gallstones,) .   Patient is a 42 year old, Spanish-speaking, female who comes in secondary to known gallstones. She states that she has pain with high fatty meals. She states that she is known she has had gallstones for several years. Patient did have an ultrasound in April 2015 which revealed multiple gallstones.  Patient states that recently she has had a low-fat diet which has helped with her pain. She states that she is ready to proceed with surgery.  Patient denies any previous abdominal operations aside from C-sections.  Review of Systems: A complete review of systems was obtained from the patient. I have reviewed this information and discussed as appropriate with the patient. See HPI as well for other ROS.  Review of Systems  Constitutional: Negative for fever.  HENT: Negative for congestion.  Eyes: Negative for blurred vision.  Respiratory: Negative for cough, shortness of breath and wheezing.  Cardiovascular: Negative for chest pain and palpitations.  Gastrointestinal: Positive for abdominal pain, nausea and vomiting. Negative for heartburn.  Genitourinary: Negative for dysuria.  Musculoskeletal: Negative for myalgias.  Skin: Negative for rash.  Neurological: Negative for dizziness and headaches.  Psychiatric/Behavioral: Negative for depression and suicidal ideas.  All other systems reviewed and are negative.   Medical History: Past Medical History:  Diagnosis Date  Anxiety   There is no problem list on file for this patient.  Past Surgical History:  Procedure Laterality Date  CESAREAN SECTION    No Known Allergies  Current Outpatient Medications on File Prior to Visit  Medication Sig Dispense Refill   multivitamin tablet Take 1 tablet by mouth once daily   No current facility-administered medications on file prior to visit.   Family History  Problem Relation Age of Onset  High blood pressure (Hypertension) Mother  Hyperlipidemia (Elevated cholesterol) Mother  Diabetes Mother  High blood pressure (Hypertension) Father  Hyperlipidemia (Elevated cholesterol) Father  Diabetes Father  Diabetes Brother  High blood pressure (Hypertension) Brother  Hyperlipidemia (Elevated cholesterol) Brother    Social History   Tobacco Use  Smoking Status Never  Smokeless Tobacco Never    Social History   Socioeconomic History  Marital status: Married  Tobacco Use  Smoking status: Never  Smokeless tobacco: Never  Substance and Sexual Activity  Alcohol use: Not Currently  Drug use: Never   Social Determinants of Health   Food Insecurity: No Food Insecurity (10/15/2022)  [facility]  Hunger Vital Sign  Worried About Running Out of Food in the Last Year: Never true  Ran Out of Food in the Last Year: Never true  Transportation Needs: No Transportation Needs (03/01/2022)  Received from [facility] - Transportation  Lack of Transportation (Medical): No  Lack of Transportation (Non-Medical): No  Received from [facility]  Social Network   Objective:   Vitals:  02/18/23 1450  BP: 135/82  Pulse: 71  Temp: 37.2 C (99 F)  SpO2: 98%  Weight: 64 kg (141 lb)  Height: 139.7 cm (4\' 7" )   Body mass index is 32.77 kg/m.  Physical Exam Constitutional:  Appearance: Normal appearance.  HENT:  Head: Normocephalic and atraumatic.  Mouth/Throat:  Mouth: Mucous membranes are moist.  Pharynx: Oropharynx is clear.  Eyes:  General: No scleral icterus. Pupils:  Pupils are equal, round, and reactive to light.  Cardiovascular:  Rate and Rhythm: Normal rate and regular rhythm.  Pulses: Normal pulses.  Heart sounds: No murmur heard. No friction rub. No gallop.   Pulmonary:  Effort: Pulmonary effort is normal. No respiratory distress.  Breath sounds: Normal breath sounds. No stridor.  Abdominal:  General: Abdomen is flat.  Musculoskeletal:  General: No swelling.  Skin: General: Skin is warm.  Neurological:  General: No focal deficit present.  Mental Status: She is alert and oriented to person, place, and time. Mental status is at baseline.  Psychiatric:  Mood and Affect: Mood normal.  Thought Content: Thought content normal.  Judgment: Judgment normal.     Assessment and Plan:  Diagnoses and all orders for this visit:  Symptomatic cholelithiasis   Marie Gray is a 42 y.o. female   We will proceed to the OR for a lap cholecystectomy. All risks and benefits were discussed with the patient to generally include: infection, bleeding, possible need for post op ERCP, damage to the bile ducts, and bile leak. Alternatives were offered and described. All questions were answered and the patient voiced understanding of the procedure and wishes to proceed at this point with a laparoscopic cholecystectomy  No follow-ups on file.  Axel Filler, MD, [facility], [facility]

## 2023-03-26 ENCOUNTER — Encounter (HOSPITAL_COMMUNITY): Payer: Self-pay | Admitting: General Surgery

## 2023-03-27 LAB — SURGICAL PATHOLOGY

## 2023-04-10 ENCOUNTER — Ambulatory Visit (INDEPENDENT_AMBULATORY_CARE_PROVIDER_SITE_OTHER): Payer: Self-pay | Admitting: Family Medicine

## 2023-04-10 ENCOUNTER — Encounter (HOSPITAL_BASED_OUTPATIENT_CLINIC_OR_DEPARTMENT_OTHER): Payer: Self-pay | Admitting: Family Medicine

## 2023-04-10 DIAGNOSIS — F419 Anxiety disorder, unspecified: Secondary | ICD-10-CM

## 2023-04-10 MED ORDER — SERTRALINE HCL 50 MG PO TABS
50.0000 mg | ORAL_TABLET | Freq: Every day | ORAL | 3 refills | Status: AC
Start: 2023-04-10 — End: ?

## 2023-04-10 MED ORDER — SERTRALINE HCL 50 MG PO TABS
50.0000 mg | ORAL_TABLET | Freq: Every day | ORAL | 3 refills | Status: DC
Start: 2023-04-10 — End: 2023-04-10

## 2023-04-10 MED ORDER — HYDROXYZINE HCL 50 MG PO TABS
50.0000 mg | ORAL_TABLET | Freq: Every day | ORAL | 2 refills | Status: AC
Start: 2023-04-10 — End: ?

## 2023-04-10 NOTE — Progress Notes (Signed)
Established Patient Office Visit  Subjective   Patient ID: Marie Gray, female    DOB: 15-Oct-1980  Age: 42 y.o. MRN: [id number]  Chief Complaint  Patient presents with   Medication Management   ANXIETY: Marie Gray is a 42 year-old female patient who presents for the medical management of anxiety. In-person translator is present for the duration of the visit.  Current medication regimen: Zoloft 25mg  daily first 2 weeks, then started taking 50mg  daily . Noticed some diarrhea and lack of appetite with increase in dose.   Counseling: not currently, no insurance  Well controlled: yes Denies SI/HI.  Hydroxyzine-usually taking at night to help her stay asleep       04/10/2023   11:00 AM 03/12/2023    2:43 PM  GAD 7 : Generalized Anxiety Score  Nervous, Anxious, on Edge 1 3  Control/stop worrying 1 2  Worry too much - different things 1 3  Trouble relaxing 1 1  Restless 0 1  Easily annoyed or irritable 0 0  Afraid - awful might happen 0 1  Total GAD 7 Score 4 11  Anxiety Difficulty Not difficult at all Not difficult at all      04/10/2023   10:59 AM 03/12/2023    2:40 PM 10/15/2022    9:45 AM  PHQ9 SCORE ONLY  PHQ-9 Total Score 4 17 0    Review of Systems  Constitutional:  Negative for malaise/fatigue.  Respiratory:  Negative for cough and shortness of breath.   Cardiovascular:  Negative for chest pain, palpitations and leg swelling.  Gastrointestinal:  Negative for abdominal pain, nausea and vomiting.  Musculoskeletal:  Negative for myalgias.  Neurological:  Negative for dizziness, weakness and headaches.  Psychiatric/Behavioral:  Negative for depression and suicidal ideas. The patient is nervous/anxious (reports it is much improved) and has insomnia.       Objective:     BP 122/64   Pulse 68   Temp 98.3 F (36.8 C) (Oral)   Ht 4\' 8"  (1.422 m)   Wt 130 lb 6.4 oz (59.1 kg)   LMP 04/09/2023   SpO2 100%   BMI 29.24 kg/m  BP Readings from Last 3  Encounters:  04/10/23 122/64  03/25/23 119/65  03/21/23 125/76     Physical Exam Constitutional:      Appearance: Normal appearance.  Cardiovascular:     Rate and Rhythm: Normal rate and regular rhythm.     Pulses: Normal pulses.     Heart sounds: Normal heart sounds.  Pulmonary:     Effort: Pulmonary effort is normal.     Breath sounds: Normal breath sounds.  Neurological:     Mental Status: She is alert.  Psychiatric:        Mood and Affect: Mood normal.        Behavior: Behavior normal.        Thought Content: Thought content normal.        Judgment: Judgment normal.       Assessment & Plan:  Anxiety Assessment & Plan: Patient presents today for anxiety follow-up. Recently started on sertraline 25mg  initially and patient took this dose for 2 weeks. After the first 2 weeks, patient increased dose to 50mg  as we discussed. Patient reports her anxiety is well controlled at current dose. GAD7 completed with score of 4, and PHQ9 completed with score of 4- indicating significant improvement since starting medication. Patient reports taking hydroxyzine nightly to help her stay asleep and reports she is not having  issues at night with sleep. Refills provided for patient today.   Orders: -     hydrOXYzine HCl; Take 1 tablet (50 mg total) by mouth at bedtime.  Dispense: 90 tablet; Refill: 2 -     Sertraline HCl; Take 1 tablet (50 mg total) by mouth daily.  Dispense: 90 tablet; Refill: 3     Return in about 6 months (around 10/08/2023) for Mood f/u.    Alyson Reedy, FNP

## 2023-04-10 NOTE — Assessment & Plan Note (Signed)
Patient presents today for anxiety follow-up. Recently started on sertraline 25mg  initially and patient took this dose for 2 weeks. After the first 2 weeks, patient increased dose to 50mg  as we discussed. Patient reports her anxiety is well controlled at current dose. GAD7 completed with score of 4, and PHQ9 completed with score of 4- indicating significant improvement since starting medication. Patient reports taking hydroxyzine nightly to help her stay asleep and reports she is not having issues at night with sleep. Refills provided for patient today.

## 2023-04-26 ENCOUNTER — Ambulatory Visit (INDEPENDENT_AMBULATORY_CARE_PROVIDER_SITE_OTHER): Payer: Self-pay | Admitting: Family Medicine

## 2023-04-26 ENCOUNTER — Encounter (HOSPITAL_BASED_OUTPATIENT_CLINIC_OR_DEPARTMENT_OTHER): Payer: Self-pay | Admitting: Family Medicine

## 2023-04-26 VITALS — BP 123/59 | HR 100 | Ht <= 58 in | Wt 128.7 lb

## 2023-04-26 DIAGNOSIS — Z9049 Acquired absence of other specified parts of digestive tract: Secondary | ICD-10-CM | POA: Insufficient documentation

## 2023-04-26 DIAGNOSIS — F419 Anxiety disorder, unspecified: Secondary | ICD-10-CM

## 2023-04-26 DIAGNOSIS — K219 Gastro-esophageal reflux disease without esophagitis: Secondary | ICD-10-CM

## 2023-04-26 MED ORDER — AMITRIPTYLINE HCL 10 MG PO TABS
10.0000 mg | ORAL_TABLET | Freq: Every evening | ORAL | 2 refills | Status: DC | PRN
Start: 2023-04-26 — End: 2023-06-06

## 2023-04-26 MED ORDER — SUCRALFATE 1 GM/10ML PO SUSP
1.0000 g | Freq: Three times a day (TID) | ORAL | 2 refills | Status: DC
Start: 2023-04-26 — End: 2024-02-21

## 2023-04-26 MED ORDER — CLONAZEPAM 0.5 MG PO TABS
0.2500 mg | ORAL_TABLET | Freq: Two times a day (BID) | ORAL | 0 refills | Status: DC | PRN
Start: 2023-04-26 — End: 2024-02-21

## 2023-04-26 NOTE — Assessment & Plan Note (Signed)
Patient presents today for concerns regarding feeling of fullness/bloating, burning sensation in her epigastric region and lack of appetite. She also reports blood mixed in her bowel movement. Normal bowel sounds, abdomen soft. Physical exam remarkable for epigastric tenderness to palpation. Prescribed patient sucralfate since she reports omeprazole is not helping relieve her symptoms. Urgent referral placed to GI for possible endoscopy and/or colonoscopy.

## 2023-04-26 NOTE — Progress Notes (Signed)
Established Patient Office Visit  Subjective   Patient ID: Marie Gray, female    DOB: 1981-02-22  Age: 42 y.o. MRN: [id number]  Chief Complaint  Patient presents with   Anxiety    Feels anxiety is worsening   Gastroesophageal Reflux    Since procedure, is still having reflux, gas, and extreme bloating   Marie Gray is a 42 year-old female patient who presents for concerns regarding feeling of fullness/bloating, acid reflux, and medical management of anxiety.   Patient presents today with concerns for continued acid reflux/gas/bloating. She had her post-op visit with the surgeon on 9/25 for recent chronic cholecystitis and cholelithiasis s/p cholecystectomy on 8/26. Her surgeon stated these symptoms are not related to the surgery.  She thought it would get better with the surgery. She reports epigastric pain/burning sensation, abdominal fullness, lack of appetite, and weight loss. She reports blood in her stool once last week. Denies hematemesis, hematochezia, melena, and intractable vomiting.  Has been taking omeprazole 20mg  OTC.   Patient reports her anxiety is worsening. She reports the hydroxyzine does not help with her panic attacks.  Denies SI/HI.       04/26/2023    9:04 AM 04/10/2023   11:00 AM 03/12/2023    2:43 PM  GAD 7 : Generalized Anxiety Score  Nervous, Anxious, on Edge 3 1 3   Control/stop worrying 3 1 2   Worry too much - different things 3 1 3   Trouble relaxing 3 1 1   Restless 2 0 1  Easily annoyed or irritable 0 0 0  Afraid - awful might happen 0 0 1  Total GAD 7 Score 14 4 11   Anxiety Difficulty Somewhat difficult Not difficult at all Not difficult at all      04/26/2023    9:03 AM 04/10/2023   10:59 AM 03/12/2023    2:40 PM  PHQ9 SCORE ONLY  PHQ-9 Total Score 9 4 17      Review of Systems  Constitutional:  Positive for weight loss. Negative for chills, fever and malaise/fatigue.  Respiratory:  Negative for cough, hemoptysis and  shortness of breath.   Cardiovascular:  Negative for chest pain, palpitations and leg swelling.  Gastrointestinal:  Positive for abdominal pain, blood in stool, constipation, diarrhea, heartburn and nausea (sometimes). Negative for melena and vomiting.  Genitourinary:  Negative for hematuria.  Neurological:  Negative for dizziness, weakness and headaches.  Psychiatric/Behavioral:  Negative for depression, substance abuse and suicidal ideas. The patient is nervous/anxious. The patient does not have insomnia.       Objective:     BP (!) 123/59   Pulse 100   Ht 4\' 8"  (1.422 m)   Wt 128 lb 11.2 oz (58.4 kg)   LMP 04/09/2023   SpO2 100%   BMI 28.85 kg/m  BP Readings from Last 3 Encounters:  04/26/23 (!) 123/59  04/10/23 122/64  03/25/23 119/65     Physical Exam Constitutional:      Appearance: Normal appearance.  Cardiovascular:     Rate and Rhythm: Normal rate and regular rhythm.     Pulses: Normal pulses.     Heart sounds: Normal heart sounds.  Pulmonary:     Effort: Pulmonary effort is normal.     Breath sounds: Normal breath sounds.  Abdominal:     General: There is no distension.     Tenderness: There is abdominal tenderness in the epigastric area.  Neurological:     Mental Status: She is alert.  Psychiatric:  Mood and Affect: Mood normal.        Behavior: Behavior normal.        Thought Content: Thought content normal.        Judgment: Judgment normal.       Assessment & Plan:  Gastroesophageal reflux disease, unspecified whether esophagitis present Assessment & Plan: Patient presents today for concerns regarding feeling of fullness/bloating, burning sensation in her epigastric region and lack of appetite. She also reports blood mixed in her bowel movement. Normal bowel sounds, abdomen soft. Physical exam remarkable for epigastric tenderness to palpation. Prescribed patient sucralfate since she reports omeprazole is not helping relieve her symptoms. Urgent  referral placed to GI for possible endoscopy and/or colonoscopy.     Orders: -     Ambulatory referral to Gastroenterology -     Sucralfate; Take 10 mLs (1 g total) by mouth 4 (four) times daily -  with meals and at bedtime.  Dispense: 473 mL; Refill: 2  S/P cholecystectomy Assessment & Plan: Post-operative appointment on 04/24/2023. Patient mentioned symptoms to surgeon and she was informed that this was not related to her surgery. Review of Dr. Jacinto Halim note from 9/25- no mention of these symptoms or discussion.   Orders: -     Ambulatory referral to Gastroenterology  Anxiety Assessment & Plan: Patient reports she is concerned about her weight loss due to her inability to eat foods. GAD7 completed today with score of 14, significantly increased from last visit. Patient would like something to increase her appetite. Will change SSRI from sertraline to amitriptyline for anxiety management. Discussed short-term medication management for panic attacks. PDMP reviewed, no red flags present.    Orders: -     Amitriptyline HCl; Take 1 tablet (10 mg total) by mouth at bedtime as needed for sleep.  Dispense: 30 tablet; Refill: 2 -     clonazePAM; Take 0.5-1 tablets (0.25-0.5 mg total) by mouth 2 (two) times daily as needed for anxiety.  Dispense: 20 tablet; Refill: 0     Return if symptoms worsen or fail to improve.    Alyson Reedy, FNP

## 2023-04-26 NOTE — Assessment & Plan Note (Signed)
Post-operative appointment on 04/24/2023. Patient mentioned symptoms to surgeon and she was informed that this was not related to her surgery. Review of Dr. Jacinto Halim note from 9/25- no mention of these symptoms or discussion.

## 2023-04-26 NOTE — Assessment & Plan Note (Signed)
Patient reports she is concerned about her weight loss due to her inability to eat foods. GAD7 completed today with score of 14, significantly increased from last visit. Patient would like something to increase her appetite. Will change SSRI from sertraline to amitriptyline for anxiety management. Discussed short-term medication management for panic attacks. PDMP reviewed, no red flags present.

## 2023-05-08 ENCOUNTER — Encounter: Payer: Self-pay | Admitting: Physician Assistant

## 2023-05-30 ENCOUNTER — Other Ambulatory Visit (HOSPITAL_BASED_OUTPATIENT_CLINIC_OR_DEPARTMENT_OTHER): Payer: Self-pay | Admitting: Family Medicine

## 2023-05-30 ENCOUNTER — Other Ambulatory Visit (HOSPITAL_BASED_OUTPATIENT_CLINIC_OR_DEPARTMENT_OTHER): Payer: Self-pay

## 2023-05-30 DIAGNOSIS — Z0000 Encounter for general adult medical examination without abnormal findings: Secondary | ICD-10-CM

## 2023-05-31 LAB — CBC WITH DIFFERENTIAL/PLATELET
Basophils Absolute: 0 10*3/uL (ref 0.0–0.2)
Basos: 0 %
EOS (ABSOLUTE): 0.1 10*3/uL (ref 0.0–0.4)
Eos: 2 %
Hematocrit: 36.8 % (ref 34.0–46.6)
Hemoglobin: 11.7 g/dL (ref 11.1–15.9)
Immature Grans (Abs): 0 10*3/uL (ref 0.0–0.1)
Immature Granulocytes: 0 %
Lymphocytes Absolute: 2 10*3/uL (ref 0.7–3.1)
Lymphs: 41 %
MCH: 30.4 pg (ref 26.6–33.0)
MCHC: 31.8 g/dL (ref 31.5–35.7)
MCV: 96 fL (ref 79–97)
Monocytes Absolute: 0.3 10*3/uL (ref 0.1–0.9)
Monocytes: 6 %
Neutrophils Absolute: 2.5 10*3/uL (ref 1.4–7.0)
Neutrophils: 51 %
Platelets: 310 10*3/uL (ref 150–450)
RBC: 3.85 x10E6/uL (ref 3.77–5.28)
RDW: 13.6 % (ref 11.7–15.4)
WBC: 4.9 10*3/uL (ref 3.4–10.8)

## 2023-05-31 LAB — COMPREHENSIVE METABOLIC PANEL
ALT: 22 [IU]/L (ref 0–32)
AST: 18 [IU]/L (ref 0–40)
Albumin: 4.3 g/dL (ref 3.9–4.9)
Alkaline Phosphatase: 70 [IU]/L (ref 44–121)
BUN/Creatinine Ratio: 16 (ref 9–23)
BUN: 9 mg/dL (ref 6–24)
Bilirubin Total: 0.3 mg/dL (ref 0.0–1.2)
CO2: 22 mmol/L (ref 20–29)
Calcium: 9.1 mg/dL (ref 8.7–10.2)
Chloride: 104 mmol/L (ref 96–106)
Creatinine, Ser: 0.57 mg/dL (ref 0.57–1.00)
Globulin, Total: 2.2 g/dL (ref 1.5–4.5)
Glucose: 84 mg/dL (ref 70–99)
Potassium: 4 mmol/L (ref 3.5–5.2)
Sodium: 138 mmol/L (ref 134–144)
Total Protein: 6.5 g/dL (ref 6.0–8.5)
eGFR: 116 mL/min/{1.73_m2} (ref >59)

## 2023-05-31 LAB — TSH RFX ON ABNORMAL TO FREE T4: TSH: 1.2 u[IU]/mL (ref 0.450–4.500)

## 2023-05-31 LAB — LIPID PANEL
Chol/HDL Ratio: 2.7 ratio (ref 0.0–4.4)
Cholesterol, Total: 186 mg/dL (ref 100–199)
HDL: 69 mg/dL (ref >39)
LDL Chol Calc (NIH): 99 mg/dL (ref 0–99)
Triglycerides: 98 mg/dL (ref 0–149)
VLDL Cholesterol Cal: 18 mg/dL (ref 5–40)

## 2023-05-31 LAB — HEMOGLOBIN A1C
Est. average glucose Bld gHb Est-mCnc: 111 mg/dL
Hgb A1c MFr Bld: 5.5 % (ref 4.8–5.6)

## 2023-06-06 ENCOUNTER — Ambulatory Visit (INDEPENDENT_AMBULATORY_CARE_PROVIDER_SITE_OTHER): Payer: Self-pay | Admitting: Family Medicine

## 2023-06-06 ENCOUNTER — Encounter (HOSPITAL_BASED_OUTPATIENT_CLINIC_OR_DEPARTMENT_OTHER): Payer: Self-pay | Admitting: Family Medicine

## 2023-06-06 VITALS — BP 119/71 | HR 76 | Temp 98.8°F | Ht <= 58 in | Wt 132.8 lb

## 2023-06-06 DIAGNOSIS — Z0000 Encounter for general adult medical examination without abnormal findings: Secondary | ICD-10-CM

## 2023-06-06 DIAGNOSIS — F419 Anxiety disorder, unspecified: Secondary | ICD-10-CM

## 2023-06-06 MED ORDER — AMITRIPTYLINE HCL 10 MG PO TABS
10.0000 mg | ORAL_TABLET | Freq: Every evening | ORAL | 3 refills | Status: DC | PRN
Start: 2023-06-06 — End: 2023-07-29

## 2023-06-06 NOTE — Assessment & Plan Note (Signed)
Provided patient with refill. She reports her mood is well controlled on current medication regimen.

## 2023-06-06 NOTE — Assessment & Plan Note (Signed)
Discussed recent fasting lab results.   Review of PMH, FH, SH, medications and HM performed. Preventative care hand-out provided.  Recommend healthy diet.  Recommend approximately 150 minutes/week of moderate intensity exercise. Recommend regular dental and vision exams. Always use seatbelt/lap and shoulder restraints. Recommend using smoke alarms and checking batteries at least twice a year. Recommend using sunscreen when outside. Discussed immunization recommendations for influenza and tetanus vaccine. Patient plans to get at health department due to lack of insurance.

## 2023-06-06 NOTE — Patient Instructions (Signed)

## 2023-06-06 NOTE — Progress Notes (Signed)
Complete physical exam  Patient: Marie Gray   DOB: 1981/01/09   42 y.o. Female  MRN: [id number]  Subjective:    Chief Complaint  Patient presents with   Annual Exam   Marie Gray is a 42 y.o. female who presents today for a complete physical exam. [employer] interpreter is present for duration of visit.  She reports consuming a general diet.  She participates in walking 20 minutes in the morning and in the afternoon 5 days per week  She generally feels well. She reports sleeping well. She does not have additional problems to discuss today.   HEALTH SCREENINGS: - Vision Screening: plass to schedule  - Dental Visits: has scheduled in 05/2023 - Pap smear: up to date - Breast Exam: done elsewhere - STD Screening: Declined - Mammogram (40+): Up to date: recommendations **Bilateral diagnostic mammogram and right breast ultrasound in 1 year.  - Colonoscopy (45+): Not applicable  - Bone Density (65+ or under 65 with predisposing conditions): Not applicable  - Lung CA screening with low-dose CT:  Not applicable Adults age 5-80 who are current cigarette smokers or quit within the last 15 years. Must have 20 pack year history.   IMMUNIZATIONS: - Tdap: Tetanus vaccination status reviewed: discussed recommendations for booster- plans to get at health dept. - HPV: Refused - Influenza: discussed recommendations- plans to get at health dept. - Pneumovax: Not applicable - Prevnar 20: Not applicable - Zostavax (50+): Not applicable   Depression screenings:    04/26/2023    9:03 AM 04/10/2023   10:59 AM 03/12/2023    2:40 PM  Depression screen PHQ 2/9  Decreased Interest 1 1 1   Down, Depressed, Hopeless 3 1 3   PHQ - 2 Score 4 2 4   Altered sleeping 2 1 3   Tired, decreased energy 0 0 3  Change in appetite 2 1 3   Feeling bad or failure about yourself  0  3  Trouble concentrating 1 0 1  Moving slowly or fidgety/restless 0 0 0  Suicidal thoughts 0  0  PHQ-9 Score 9 4 17    Difficult doing work/chores Not difficult at all  Somewhat difficult    Anxiety screenings:    04/26/2023    9:04 AM 04/10/2023   11:00 AM 03/12/2023    2:43 PM  GAD 7 : Generalized Anxiety Score  Nervous, Anxious, on Edge 3 1 3   Control/stop worrying 3 1 2   Worry too much - different things 3 1 3   Trouble relaxing 3 1 1   Restless 2 0 1  Easily annoyed or irritable 0 0 0  Afraid - awful might happen 0 0 1  Total GAD 7 Score 14 4 11   Anxiety Difficulty Somewhat difficult Not difficult at all Not difficult at all   Patient Care Team: Alyson Reedy, FNP as PCP - General (Family Medicine)   Outpatient Medications Prior to Visit  Medication Sig   Multiple Vitamin (MULTIVITAMIN) tablet Take 1 tablet by mouth daily.   sucralfate (CARAFATE) 1 GM/10ML suspension Take 10 mLs (1 g total) by mouth 4 (four) times daily -  with meals and at bedtime.   [DISCONTINUED] amitriptyline (ELAVIL) 10 MG tablet Take 1 tablet (10 mg total) by mouth at bedtime as needed for sleep.   clonazePAM (KLONOPIN) 0.5 MG tablet Take 0.5-1 tablets (0.25-0.5 mg total) by mouth 2 (two) times daily as needed for anxiety. (Patient not taking: Reported on 06/06/2023)   No facility-administered medications prior to visit.    Review  of Systems  Constitutional:  Negative for malaise/fatigue and weight loss.  HENT:  Negative for congestion and hearing loss.   Eyes:  Negative for blurred vision and double vision.  Respiratory:  Negative for cough and shortness of breath.   Cardiovascular:  Negative for chest pain and palpitations.  Gastrointestinal:  Negative for abdominal pain, heartburn, nausea and vomiting.  Genitourinary:  Negative for frequency and urgency.  Musculoskeletal:  Negative for myalgias.  Neurological:  Negative for dizziness, weakness and headaches.  Psychiatric/Behavioral:  Negative for depression, substance abuse and suicidal ideas. The patient is not nervous/anxious and does not have insomnia.        Objective:     BP 119/71   Pulse 76   Temp 98.8 F (37.1 C) (Oral)   Ht 4\' 8"  (1.422 m)   Wt 132 lb 12.8 oz (60.2 kg)   SpO2 100%   BMI 29.77 kg/m  BP Readings from Last 3 Encounters:  06/06/23 119/71  04/26/23 (!) 123/59  04/10/23 122/64    Physical Exam Vitals reviewed. Chaperone present: interpreter present.  Constitutional:      Appearance: Normal appearance.  HENT:     Right Ear: Tympanic membrane, ear canal and external ear normal.     Left Ear: Tympanic membrane, ear canal and external ear normal.     Mouth/Throat:     Mouth: Mucous membranes are moist.     Pharynx: Oropharynx is clear.  Eyes:     Extraocular Movements: Extraocular movements intact.     Conjunctiva/sclera: Conjunctivae normal.     Pupils: Pupils are equal, round, and reactive to light.  Cardiovascular:     Rate and Rhythm: Normal rate and regular rhythm.     Pulses: Normal pulses.     Heart sounds: Normal heart sounds.  Pulmonary:     Effort: Pulmonary effort is normal.     Breath sounds: Normal breath sounds.  Abdominal:     General: Abdomen is flat. Bowel sounds are normal.     Palpations: Abdomen is soft.  Musculoskeletal:        General: Normal range of motion.     Cervical back: Normal range of motion and neck supple.  Skin:    General: Skin is warm and dry.  Neurological:     Mental Status: She is alert.  Psychiatric:        Mood and Affect: Mood normal.        Behavior: Behavior normal.        Thought Content: Thought content normal.        Judgment: Judgment normal.         Assessment & Plan:    Routine Health Maintenance and Physical Exam  Health Maintenance  Topic Date Due   DTaP/Tdap/Td vaccine (2 - Tdap) 05/07/2016   COVID-19 Vaccine (1 - 2023-24 season) Never done   Flu Shot  10/28/2023*   Mammogram  03/20/2024   Pap with HPV screening  05/25/2027   Hepatitis C Screening  Completed   HIV Screening  Completed   HPV Vaccine  Aged Out  *Topic was postponed.  The date shown is not the original due date.    Wellness examination Assessment & Plan: Discussed recent fasting lab results.   Review of PMH, FH, SH, medications and HM performed. Preventative care hand-out provided.  Recommend healthy diet.  Recommend approximately 150 minutes/week of moderate intensity exercise. Recommend regular dental and vision exams. Always use seatbelt/lap and shoulder restraints. Recommend using smoke alarms  and checking batteries at least twice a year. Recommend using sunscreen when outside. Discussed immunization recommendations for influenza and tetanus vaccine. Patient plans to get at health department due to lack of insurance.    Anxiety Assessment & Plan: Provided patient with refill. She reports her mood is well controlled on current medication regimen.   Orders: -     Amitriptyline HCl; Take 1 tablet (10 mg total) by mouth at bedtime as needed for sleep.  Dispense: 90 tablet; Refill: 3     Return in about 1 year (around 06/05/2024) for Physical with fasting labs.     Alyson Reedy, FNP

## 2023-06-12 ENCOUNTER — Telehealth: Payer: Self-pay

## 2023-06-12 NOTE — Telephone Encounter (Signed)
Health Coaching 3  interpreter- [name], [facility]   Goals- Patient has been walking twice a day for 20 minutes in the AM and 20 minutes in the PM. Patient has also been trying to cut back on the amount of sugar that she consumes. Patient is happy with the changes that she has made and will try to continue with the changes through the holiday season.   Navigation:  Patient is aware of a follow up session.Patient is scheduled for FU on 07/29/23   Time- 10 minutes

## 2023-06-20 ENCOUNTER — Encounter (HOSPITAL_BASED_OUTPATIENT_CLINIC_OR_DEPARTMENT_OTHER): Payer: Self-pay | Admitting: Family Medicine

## 2023-07-29 ENCOUNTER — Inpatient Hospital Stay: Payer: Self-pay | Attending: Obstetrics and Gynecology | Admitting: *Deleted

## 2023-07-29 VITALS — BP 121/60 | Ht <= 58 in | Wt 137.2 lb

## 2023-07-29 DIAGNOSIS — Z0000 Encounter for general adult medical examination without abnormal findings: Secondary | ICD-10-CM

## 2023-07-29 NOTE — Progress Notes (Signed)
[facility] follow up   Interpreter: [name], [employer]  Clinical Measurement:   Vitals:   07/29/23 1001 07/29/23 1010  BP: 120/75 121/60      Medical History: Patient states that she does not have high cholesterol, does not have high blood pressure and she does not have diabetes. Patient states that she does not have history of gestational hypertension, does not have history of pre-eclampsia/eclampsia and she has history of gestational diabetes.     Medications: Patient states that she does not take medication to lower cholesterol, blood pressure and blood sugar.  Patient does not take an aspirin a day to help prevent a heart attack or stroke.    Blood pressure, self measurement: Patient states that she does not measure blood pressure from home. She checks her blood pressure N/A. She shares her readings with a health care provider: N/A.   Nutrition: Patient states that on average she eats 3 cups of fruit and 2 cups of vegetables per day. Patient states that she does eat fish at least 2 times per week. Patient eats more than half servings of whole grains. Patient drinks less than 36 ounces of beverages with added sugar weekly: yes. Patient is currently watching sodium or salt intake: yes. In the past 7 days patient has had 0 drinks containing alcohol. On average patient drinks 0 drinks containing alcohol per day.      Physical activity: Patient states that she gets 210 minutes of moderate and 0 minutes of vigorous physical activity each week.  Smoking status: Patient states that she has has never smoked .   Quality of life: Over the past 2 weeks patient states that she had little interest or pleasure in doing things: not at all. She has been feeling down, depressed or hopeless:not at all.   Social Determinants of Health Assessment:   Computer Use: During the last 12 months patient states that she has used any of the following: desktop/laptop, smart phone or tablet/other portable wireless  computer: yes.   Internet Use: During the last 12 months, did you or any member of your household have access to the internet: Yes, by paying a cell phone company or internet service provider.   Food Insecurities: During the last 12 months, where there any times when you were worried that you would run out of food because of a lack of money or other resources: Yes.   Transportation Barriers: During the last 12 months, have you missed a doctor's appointment because of transportation problems: No.   Childcare Barriers: If you are currently using childcare services, please identify  the type of services you use. (If not using childcare services, please select "Not applicable"): not applicable. During the last 12 months, have you had any barriers to childcare services such as: not applicable.   Housing: What is your housing situation today: I have housing.   Intimate Partner Violence: During the last 12 months, how often did your partner physically hurt you: never. During the last 12 months, how often did your partner insult you or talk down to you: never.  Medication Adherence: During the last 12 months, did you ever forget to take your medicine: No. During the last 12 months, were you careless ar times about taking your medicine: No. During the last 12 months, when you felt better did you sometimes stop taking your medication: No. During the last 12 months, sometimes if you felt worse when you took your medicine did you stop taking it: No.  Risk reduction and counseling:   Health Coaching: Patient has made great lifestyle changes since initial screening visit. Patient is making healthier food choices. Patient is currently walking daily for 30 minutes. When the weather was warmer she was walking twice a day. She plans on going back to twice daily walks once the weather gets warmer. Patient has lost 7 pounds since her initial screening. Encouraged patient to keep up the great work!    Navigation:  This was the  follow up session for this patient, I will check up on her progress in the coming months.  Time: 25 minutes

## 2023-08-12 ENCOUNTER — Ambulatory Visit: Payer: Self-pay | Admitting: Physician Assistant

## 2023-10-08 ENCOUNTER — Ambulatory Visit (HOSPITAL_BASED_OUTPATIENT_CLINIC_OR_DEPARTMENT_OTHER): Payer: Self-pay | Admitting: Family Medicine

## 2024-01-07 ENCOUNTER — Other Ambulatory Visit: Payer: Self-pay

## 2024-01-07 ENCOUNTER — Telehealth: Payer: Self-pay

## 2024-01-07 DIAGNOSIS — N6310 Unspecified lump in the right breast, unspecified quadrant: Secondary | ICD-10-CM

## 2024-01-07 DIAGNOSIS — Z87898 Personal history of other specified conditions: Secondary | ICD-10-CM

## 2024-01-07 NOTE — Telephone Encounter (Signed)
 Telephoned patient at mobile number using interpreter, Mechele Spiegel. Left a voice message with scheduling information. [facility]

## 2024-02-21 ENCOUNTER — Ambulatory Visit: Payer: Self-pay

## 2024-02-21 ENCOUNTER — Ambulatory Visit (INDEPENDENT_AMBULATORY_CARE_PROVIDER_SITE_OTHER): Payer: Self-pay | Admitting: Family Medicine

## 2024-02-21 ENCOUNTER — Encounter (HOSPITAL_BASED_OUTPATIENT_CLINIC_OR_DEPARTMENT_OTHER): Payer: Self-pay | Admitting: Family Medicine

## 2024-02-21 VITALS — BP 127/75 | HR 67 | Ht <= 58 in | Wt 147.7 lb

## 2024-02-21 DIAGNOSIS — N644 Mastodynia: Secondary | ICD-10-CM

## 2024-02-21 MED ORDER — SERTRALINE HCL 50 MG PO TABS: 50.0000 mg | ORAL_TABLET | Freq: Every day | ORAL | 5 refills | Status: DC

## 2024-02-21 NOTE — Progress Notes (Signed)
 Acute Care Office Visit  Subjective:   Marie Gray 1980/08/30 02/21/2024  Chief Complaint  Patient presents with   Breast Pain    Patient has been having pain in right breast off and on for about 1 year but states things have gotten worse with pain and discomfort over the last 2-3 months.    HPI:  Due to language barrier, a medical interpreter was present during the HPI, ROS, and discussion for the plan of care.  Interpreter: [place] [phone]   Patient has a hx of a benign right sided breast mass per chart review that has been monitored for the past 2 years with mammogram surveillance. She is scheduled for diagnostic mammo and right breast US  on 03/26/2024. She states her right breast has been having worsening discomfort and pain and discomfort for the past 2-3 months.   She denies redness, warmth, drainage, or nipple discharge from breasts.      The following portions of the patient's history were reviewed and updated as appropriate: past medical history, past surgical history, family history, social history, allergies, medications, and problem list.   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  This does not mean the patient has pelvic endometriosis; this endometrioma occurred specifically during cesarean section when exposed endometrial cells became implanted in subcutaneous tissue     Depression    Gall stones    GERD (gastroesophageal reflux disease)    pyloric stenosis   Headache    migraines in the past   History of gestational diabetes 02/06/2007   Metrorrhagia 11/23/2008   Qualifier: Diagnosis of   By: Sharlet MD,  James         PONV (postoperative nausea and vomiting)    Premenstrual tension syndrome 10/24/2010   Past Surgical History:  Procedure Laterality Date   CESAREAN SECTION  2001, 2008   x 2   CHOLECYSTECTOMY N/A 03/25/2023   Procedure: LAPAROSCOPIC CHOLECYSTECTOMY;  Surgeon: Rubin Calamity, MD;  Location: [facility] OR;  Service: General;  Laterality: N/A;   ESOPHAGOGASTRODUODENOSCOPY N/A 12/02/2013   Procedure: ESOPHAGOGASTRODUODENOSCOPY (EGD);  Surgeon: Lamar LULLA Bunk, MD;  Location: [facility];  Service: Endoscopy;  Laterality: N/A;   LIPOMA EXCISION N/A 09/30/2013   Procedure: Excision of  Cesarean section scar lesion ;  Surgeon: Gloris DELENA Hugger, MD;  Location: [facility];  Service: Gynecology;  Laterality: N/A;   UTERINE FIBROID SURGERY     when she had her c-section   Family History  Problem Relation Age of Onset   Diabetes Mother    Hypertension Mother    Diabetes Father    Diabetes Brother    Diabetes Maternal Uncle    Heart disease Paternal Grandfather    Outpatient Medications Prior to Visit  Medication Sig Dispense Refill   Multiple Vitamin (MULTIVITAMIN) tablet Take 1 tablet by mouth daily.     sertraline  (ZOLOFT ) 50 MG tablet Take 50 mg by mouth daily.     clonazePAM  (KLONOPIN ) 0.5 MG tablet Take 0.5-1 tablets (0.25-0.5 mg total) by mouth 2 (two) times daily as needed for anxiety. (Patient not taking: Reported on 07/29/2023) 20 tablet 0   sucralfate  (  CARAFATE ) 1 GM/10ML suspension Take 10 mLs (1 g total) by mouth 4 (four) times daily -  with meals and at bedtime. (Patient not taking: Reported on 07/29/2023) 473 mL 2   No facility-administered medications prior to visit.   No Known Allergies   ROS: A complete ROS was performed with pertinent positives/negatives noted in the HPI. The remainder of the ROS are negative.    Objective:   Today's Vitals   02/21/24 1038  BP: 127/75  Pulse: 67  SpO2: 100%  Weight: 147 lb 11.2 oz (67 kg)  Height: 4' 8 (1.422 m)     GENERAL: Well-appearing, in NAD. Well nourished.  SKIN: Pink, warm and dry. No rash, lesion, ulceration, or ecchymoses.  Head: Normocephalic. NECK: Trachea midline. Full ROM w/o pain or tenderness.  RESPIRATORY: Chest wall symmetrical. Respirations even and non-labored.  BREASTS: Breasts pendulous, symmetrical, and w/ palpable nodule present to right upper outer quadrant of breast with tenderness at 10 o'clock to 12 o'clock of the right breast.  Nipples everted and w/o discharge. No rash or skin retraction. No axillary or supraclavicular lymphadenopathy. Chaperoned by Damien Many CMA.   MSK: Muscle tone and strength appropriate for age.  NEUROLOGIC: No motor or sensory deficits. Steady, even gait. C2-C12 intact.  PSYCH/MENTAL STATUS: Alert, oriented x 3. Cooperative, appropriate mood and affect.    No results found for any visits on 02/21/24.    Assessment & Plan:  1. Breast pain, right (Primary) Uncontrolled and worsening pain. Will obtain STAT diagnostic mammo and US  right breast w/ axillae to rule out concerning abscess, nodule or malignancy. Will be done at breast center. Pt agreeable.  - MM 3D DIAGNOSTIC MAMMOGRAM BILATERAL BREAST; Future - US  BREAST COMPLETE UNI RIGHT INC AXILLA; Future   Meds ordered this encounter  Medications   sertraline  (ZOLOFT ) 50 MG tablet    Sig: Take 1 tablet (50 mg total) by mouth daily.    Dispense:  30 tablet    Refill:  5    Supervising Provider:   DE PERU, RAYMOND J [[id number]]   Lab Orders  No laboratory test(s) ordered today   No images are attached to the encounter or orders placed in the encounter.  Return if symptoms worsen or fail to improve.    Patient to reach out to office if new, worrisome, or unresolved symptoms arise or if no improvement in patient's condition. Patient verbalized understanding and is agreeable to treatment plan. All questions answered to patient's satisfaction.    Thersia Schuyler Stark, [place]

## 2024-02-21 NOTE — Telephone Encounter (Signed)
 FYI Only or Action Required?: Action required by provider: medication refill request.  Patient was last seen in primary care on 06/06/2023 by Towana Small, FNP.  Called Nurse Triage reporting Medication Refill and Breast Pain.  Symptoms began several weeks ago.  Interventions attempted: OTC medications: ibuprofen .  Symptoms are: gradually worsening.  Triage Disposition: See PCP Within 2 Weeks  Patient/caregiver understands and will follow disposition?: Yes                             Copied from CRM [id number]. Topic: Clinical - Red Word Triage >> Feb 21, 2024  9:30 AM Donna BRAVO wrote: Red Word that prompted transfer to Nurse Triage: Patient daughter [name] requesting refill Medication:sertraline  (ZOLOFT ) 50 MG tablet  Patient has breast pain for over a year, patient says the pain is different      ----------------------------------------------------------------------- From previous Reason for Contact - Medication Question: Reason for CRM: Patient daughter [name] requesting refill Medication:sertraline  (ZOLOFT ) 50 MG tablet  Patient has breast pain for over a year, patient says the pain is different Reason for Disposition  [1] Breast pain AND [2] cause is not known  Answer Assessment - Initial Assessment Questions 1. SYMPTOM: What's the main symptom you're concerned about?  (e.g., lump, nipple discharge, pain, rash)     Daughter states patient has been followed for awhile for a benign cyst in her right breast, but the discomfort has been worsening over past 2 weeks 2. LOCATION: Where is the cyst located?     Right breast 3. ONSET: When did discomfort  start?     2 weeks ago 4. PRIOR HISTORY: Do you have any history of prior problems with your breasts? (e.g., breast cancer, breast implant, fibrocystic breast disease)     Yes 5. CAUSE: What do you think is causing this symptom?     Unsure, states she has been seen for a benign  cyst 6. OTHER SYMPTOMS: Do you have any other symptoms? (e.g., breast pain, fever, nipple discharge, redness or rash)     Denies fever, denies discharge, denies redness, denies swelling    Rates pain a 5, states ibuprofen  helps. Patient also requesting refill of Zoloft . Patient used to see Small Towana. Please advise.  Protocols used: Breast Symptoms-A-AH

## 2024-02-27 ENCOUNTER — Ambulatory Visit: Payer: Self-pay

## 2024-02-27 VITALS — Wt 146.4 lb

## 2024-02-27 DIAGNOSIS — Z1239 Encounter for other screening for malignant neoplasm of breast: Secondary | ICD-10-CM

## 2024-02-27 DIAGNOSIS — N644 Mastodynia: Secondary | ICD-10-CM

## 2024-02-27 NOTE — Progress Notes (Signed)
 [name] is a 43 y.o. female who presents to [facility] clinic today with complaint of right breast pain x 1 year that comes and goes. Patient rates the pain at a 2 out of 10.    Pap Smear: Pap smear not completed today. Last Pap smear was 09/22/2018 at the [facility] cancer screening [facility] and was normal with negative HPV. Per patient has no history of an abnormal Pap smear. Last Pap smear result is available in Epic.    Physical exam: Breasts Right breast slightly larger than left breast that per patient is normal for her. No skin abnormalities bilateral breasts. No nipple retraction bilateral breasts. No nipple discharge bilateral breasts. No lymphadenopathy. No lumps palpated bilateral breasts. No complaints of pain or tenderness on exam.     MS 3D DIAG MAMMO BILAT BR (aka MM) Result Date: 03/21/2023 CLINICAL DATA:  Short-term follow-up for a likely benign right breast mass. The patient also has diffuse right breast pain for 1 year. EXAM: DIGITAL DIAGNOSTIC BILATERAL MAMMOGRAM WITH TOMOSYNTHESIS AND CAD; ULTRASOUND RIGHT BREAST LIMITED TECHNIQUE: Bilateral digital diagnostic mammography and breast tomosynthesis was performed. The images were evaluated with computer-aided detection. ; Targeted ultrasound examination of the right breast was performed COMPARISON:  Previous exam(s). ACR Breast Density Category c: The breasts are heterogeneously dense, which may obscure small masses. FINDINGS: The mass in the lower outer posterior right breast appears less dense than on the comparison exam, but is still present and similar in size. No suspicious calcifications, new masses or areas of distortion are seen in the bilateral breasts. Ultrasound of the right breast at 7 o'clock, 3 cm from the nipple demonstrates a stable oval hypoechoic mass measuring 7 x 3 x 7 mm, previously measuring 8 x 3 x 7 mm in August of 2023. IMPRESSION: 1.  The likely benign right breast mass at 7 o'clock is stable. 2. No  suspicious mammographic findings to correspond with the patient's diffuse right breast pain. 3.  No evidence of malignancy in the bilateral breasts. RECOMMENDATION: Bilateral diagnostic mammogram and right breast ultrasound in 1 year. I have discussed the findings and recommendations with the patient. If applicable, a reminder letter will be sent to the patient regarding the next appointment. BI-RADS CATEGORY  3: Probably benign. Electronically Signed   By: Rosaline Collet M.D.   On: 03/21/2023 11:45   MM DIAG BREAST TOMO UNI RIGHT Result Date: 03/13/2022 CLINICAL DATA:  The patient was called back from screening mammography due to a right breast mass. EXAM: ULTRASOUND RIGHT BREAST LIMITED; DIGITAL DIAGNOSTIC UNILATERAL RIGHT MAMMOGRAM WITH TOMOSYNTHESIS TECHNIQUE: Targeted ultrasound examination of the right breast was performed; Right digital diagnostic mammography and breast tomosynthesis was performed. COMPARISON:  Previous exam(s). ACR Breast Density Category c: The breast tissue is heterogeneously dense, which may obscure small masses. FINDINGS: The right mass persists on today's imaging. Targeted ultrasound is performed, showing a complicated cyst deep in the right breast at 7 o'clock, 3 cm from the nipple correlating with the mammographic finding. The mass measures 8 x 3 x 7 mm and demonstrates septations but no obvious solid components. IMPRESSION: The mammographically identified mass appears to be a cluster of cysts. RECOMMENDATION: Recommend six-month follow-up ultrasound of the cluster of cysts on the right. I have discussed the findings and recommendations with the patient. If applicable, a reminder letter will be sent to the patient regarding the next appointment. BI-RADS CATEGORY  3: Probably benign. Electronically Signed   By: Alm Pouch III M.D.  On: 03/13/2022 10:54  MS DIGITAL SCREENING TOMO BILATERAL Result Date: 03/02/2022 CLINICAL DATA:  Screening. EXAM: DIGITAL SCREENING BILATERAL  MAMMOGRAM WITH TOMOSYNTHESIS AND CAD TECHNIQUE: Bilateral screening digital craniocaudal and mediolateral oblique mammograms were obtained. Bilateral screening digital breast tomosynthesis was performed. The images were evaluated with computer-aided detection. COMPARISON:  Previous exam(s). ACR Breast Density Category c: The breast tissue is heterogeneously dense, which may obscure small masses. FINDINGS: In the right breast, a possible asymmetry warrants further evaluation. In the left breast, no findings suspicious for malignancy. IMPRESSION: Further evaluation is suggested for possible asymmetry in the right breast. RECOMMENDATION: Diagnostic mammogram and possibly ultrasound of the right breast. (Code:FI-R-34M) The patient will be contacted regarding the findings, and additional imaging will be scheduled. BI-RADS CATEGORY  0: Incomplete. Need additional imaging evaluation and/or prior mammograms for comparison. Electronically Signed   By: Dobrinka  Dimitrova M.D.   On: 03/02/2022 16:25   MS DIGITAL SCREENING TOMO BILATERAL Result Date: 01/21/2021 CLINICAL DATA:  Screening. EXAM: DIGITAL SCREENING BILATERAL MAMMOGRAM WITH TOMOSYNTHESIS AND CAD TECHNIQUE: Bilateral screening digital craniocaudal and mediolateral oblique mammograms were obtained. Bilateral screening digital breast tomosynthesis was performed. The images were evaluated with computer-aided detection. COMPARISON:  None. ACR Breast Density Category c: The breast tissue is heterogeneously dense, which may obscure small masses FINDINGS: There are no findings suspicious for malignancy. IMPRESSION: No mammographic evidence of malignancy. A result letter of this screening mammogram will be mailed directly to the patient. RECOMMENDATION: Screening mammogram in one year. (Code:[id number]-01Y) BI-RADS CATEGORY  1: Negative. Electronically Signed   By: Reyes Phi M.D.   On: 01/21/2021 13:00   Pelvic/Bimanual Pap is not indicated today per BCCCP guidelines.    Smoking History: Patient has never smoked.   Patient Navigation: Patient education provided. Access to services provided for patient through [place] program. Spanish interpreter Bernice Angry from [facility] provided. Patient has food insecurities. Patient escorted to the food market at the [facility] for groceries.     Breast and Cervical Cancer Risk Assessment: Patient does not have family history of breast cancer, known genetic mutations, or radiation treatment to the chest before age 9. Patient does not have history of cervical dysplasia, immunocompromised, or DES exposure in-utero.  Risk Scores as of Encounter on 02/27/2024     [place]           5-year 0.64%   Lifetime 8.79%   This patient is [place] but has no documented birth country, so the [place] model used data from [place] patients to calculate their risk score. Document a birth country in the Demographics activity for a more accurate score.         Last calculated by Logan Lyle BRAVO, CMA on 02/27/2024 at 11:41 AM        A: BCCCP exam without pap smear Complaint of right breast pain.  P: Referred patient to the [facility] for a diagnostic mammogram. Appointment scheduled Friday, February 28, 2024 at 1050.  Driscilla Wanda SQUIBB, RN 02/27/2024 1:26 PM

## 2024-02-27 NOTE — Patient Instructions (Signed)
 Explained breast self awareness with Marie Gray. Patient did not need a Pap smear today due to last Pap smear and HPV typing was 05/24/2022. Let her know BCCCP will cover Pap smears and HPV typing every 5 years unless has a history of abnormal Pap smears. Referred patient to the [facility] for a diagnostic mammogram. Appointment scheduled Friday, February 28, 2024 at 1050. Patient aware of appointment and will be there. Marie Gray verbalized understanding.  Thomasa Heidler, Wanda Ship, RN 1:26 PM

## 2024-02-28 ENCOUNTER — Ambulatory Visit
Admission: RE | Admit: 2024-02-28 | Discharge: 2024-02-28 | Disposition: A | Discharge status: Home / Self Care | Payer: Self-pay | Source: Ambulatory Visit | Attending: Obstetrics and Gynecology | Admitting: Obstetrics and Gynecology

## 2024-02-28 DIAGNOSIS — Z87898 Personal history of other specified conditions: Secondary | ICD-10-CM

## 2024-03-26 ENCOUNTER — Other Ambulatory Visit: Payer: Self-pay

## 2024-03-26 ENCOUNTER — Ambulatory Visit: Payer: Self-pay

## 2024-06-05 ENCOUNTER — Other Ambulatory Visit (HOSPITAL_BASED_OUTPATIENT_CLINIC_OR_DEPARTMENT_OTHER): Payer: Self-pay | Admitting: Family Medicine

## 2024-06-05 ENCOUNTER — Other Ambulatory Visit (HOSPITAL_BASED_OUTPATIENT_CLINIC_OR_DEPARTMENT_OTHER): Payer: Self-pay

## 2024-06-05 DIAGNOSIS — Z13220 Encounter for screening for lipoid disorders: Secondary | ICD-10-CM

## 2024-06-05 DIAGNOSIS — Z0000 Encounter for general adult medical examination without abnormal findings: Secondary | ICD-10-CM

## 2024-06-06 LAB — CBC WITH DIFFERENTIAL/PLATELET
Basophils Absolute: 0 x10E3/uL (ref 0.0–0.2)
Basos: 1 %
EOS (ABSOLUTE): 0.1 x10E3/uL (ref 0.0–0.4)
Eos: 1 %
Hematocrit: 40.2 % (ref 34.0–46.6)
Hemoglobin: 12.6 g/dL (ref 11.1–15.9)
Immature Grans (Abs): 0 x10E3/uL (ref 0.0–0.1)
Immature Granulocytes: 0 %
Lymphocytes Absolute: 2.4 x10E3/uL (ref 0.7–3.1)
Lymphs: 44 %
MCH: 29.3 pg (ref 26.6–33.0)
MCHC: 31.3 g/dL — ABNORMAL LOW (ref 31.5–35.7)
MCV: 94 fL (ref 79–97)
Monocytes Absolute: 0.4 x10E3/uL (ref 0.1–0.9)
Monocytes: 7 %
Neutrophils Absolute: 2.5 x10E3/uL (ref 1.4–7.0)
Neutrophils: 47 %
Platelets: 328 x10E3/uL (ref 150–450)
RBC: 4.3 x10E6/uL (ref 3.77–5.28)
RDW: 15 % (ref 11.7–15.4)
WBC: 5.4 x10E3/uL (ref 3.4–10.8)

## 2024-06-06 LAB — LIPID PANEL
Chol/HDL Ratio: 3.3 ratio (ref 0.0–4.4)
Cholesterol, Total: 216 mg/dL — ABNORMAL HIGH (ref 100–199)
HDL: 66 mg/dL (ref >39)
LDL Chol Calc (NIH): 136 mg/dL — ABNORMAL HIGH (ref 0–99)
Triglycerides: 82 mg/dL (ref 0–149)
VLDL Cholesterol Cal: 14 mg/dL (ref 5–40)

## 2024-06-06 LAB — COMPREHENSIVE METABOLIC PANEL WITH GFR
ALT: 27 IU/L (ref 0–32)
AST: 20 IU/L (ref 0–40)
Albumin: 4.5 g/dL (ref 3.9–4.9)
Alkaline Phosphatase: 66 IU/L (ref 41–116)
BUN/Creatinine Ratio: 24 — ABNORMAL HIGH (ref 9–23)
BUN: 15 mg/dL (ref 6–24)
Bilirubin Total: 0.4 mg/dL (ref 0.0–1.2)
CO2: 21 mmol/L (ref 20–29)
Calcium: 9.4 mg/dL (ref 8.7–10.2)
Chloride: 104 mmol/L (ref 96–106)
Creatinine, Ser: 0.62 mg/dL (ref 0.57–1.00)
Globulin, Total: 2.7 g/dL (ref 1.5–4.5)
Glucose: 92 mg/dL (ref 70–99)
Potassium: 4.5 mmol/L (ref 3.5–5.2)
Sodium: 139 mmol/L (ref 134–144)
Total Protein: 7.2 g/dL (ref 6.0–8.5)
eGFR: 113 mL/min/1.73 (ref >59)

## 2024-06-06 LAB — TSH: TSH: 1.55 u[IU]/mL (ref 0.450–4.500)

## 2024-06-12 ENCOUNTER — Ambulatory Visit (INDEPENDENT_AMBULATORY_CARE_PROVIDER_SITE_OTHER): Payer: Self-pay | Admitting: Family Medicine

## 2024-06-12 ENCOUNTER — Encounter (HOSPITAL_BASED_OUTPATIENT_CLINIC_OR_DEPARTMENT_OTHER): Payer: Self-pay | Admitting: Family Medicine

## 2024-06-12 ENCOUNTER — Other Ambulatory Visit (HOSPITAL_BASED_OUTPATIENT_CLINIC_OR_DEPARTMENT_OTHER): Payer: Self-pay

## 2024-06-12 VITALS — BP 126/64 | HR 67 | Ht <= 58 in | Wt 147.0 lb

## 2024-06-12 DIAGNOSIS — B009 Herpesviral infection, unspecified: Secondary | ICD-10-CM | POA: Insufficient documentation

## 2024-06-12 DIAGNOSIS — E7800 Pure hypercholesterolemia, unspecified: Secondary | ICD-10-CM | POA: Insufficient documentation

## 2024-06-12 DIAGNOSIS — Z0000 Encounter for general adult medical examination without abnormal findings: Secondary | ICD-10-CM

## 2024-06-12 DIAGNOSIS — Z23 Encounter for immunization: Secondary | ICD-10-CM

## 2024-06-12 MED ORDER — SERTRALINE HCL 50 MG PO TABS
50.0000 mg | ORAL_TABLET | Freq: Every day | ORAL | 3 refills | Status: AC
  Filled 2024-06-12: qty 90, 90d supply, fill #0

## 2024-06-12 MED ORDER — VALACYCLOVIR HCL 1 G PO TABS
ORAL_TABLET | ORAL | 0 refills | Status: AC
Start: 2024-06-12 — End: ?
  Filled 2024-06-12: qty 32, 15d supply, fill #0

## 2024-06-12 NOTE — Progress Notes (Signed)
 Subjective:   Marie Gray 1981/05/29  06/12/2024   CC: Chief Complaint  Patient presents with   Annual Exam    Pt is here today for her physical. 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    HPI: Marie Gray is a 43 y.o. female who presents for a routine health maintenance exam.  Labs collected at time of visit.   Due to language barrier, a medical interpreter was present during the HPI, ROS, and discussion for the plan of care.  Interpreter: Katrina    FACIAL SPASM:  Patient reports approx. 2 weeks ago she began twitching in her upper right lip aspect. She denies swelling, numbness or tingling. Denies injections to lips or sores/bump appearance. Patient states it feels like  a pimple to the area and feels like something is moving. She had hx of Bell's Palsy in 2010. Symptoms at that time began with a headache. Reports sensation is constant. Denies of HPV.    HEALTH SCREENINGS: - Vision Screening: not applicable - Dental Visits: Recommended - Pap smear: up to date - Breast Exam: Declined - STD Screening: Declined - Mammogram (40+): Up to date  - Colonoscopy (45+): Not applicable  - Bone Density (65+ or under 65 with predisposing conditions): Not applicable  - Lung CA screening with low-dose CT:  Not applicable Adults age 54-80 who are current cigarette smokers or quit within the last 15 years. Must have 20 pack year history.   Depression and Anxiety Screen done today and results listed below:     06/12/2024    9:54 AM 02/21/2024   10:43 AM 04/26/2023    9:03 AM 04/10/2023   10:59 AM 03/12/2023    2:40 PM  Depression screen PHQ 2/9  Decreased Interest 0 0 1 1 1   Down, Depressed, Hopeless 0 0 3 1 3   PHQ - 2 Score 0 0 4 2 4   Altered sleeping 0 0 2 1 3   Tired, decreased energy 0 0 0 0 3  Change in appetite 0 0 2 1 3   Feeling bad or failure about yourself  0 0 0  3  Trouble  concentrating 0 1 1 0 1  Moving slowly or fidgety/restless 0 0 0 0 0  Suicidal thoughts 0 0 0  0  PHQ-9 Score 0 1  9  4  17    Difficult doing work/chores Not difficult at all Not difficult at all Not difficult at all  Somewhat difficult     Data saved with a previous flowsheet row definition      06/12/2024    9:54 AM 02/21/2024   10:44 AM 04/26/2023    9:04 AM 04/10/2023   11:00 AM  GAD 7 : Generalized Anxiety Score  Nervous, Anxious, on Edge 0 0 3 1  Control/stop worrying 0 0 3 1  Worry too much - different things 0 0 3 1  Trouble relaxing 0 0 3 1  Restless 0 0 2 0  Easily annoyed or irritable 0 0 0 0  Afraid - awful might happen 0 0 0 0  Total GAD 7 Score 0 0 14 4  Anxiety Difficulty Not difficult at all Not difficult at all Somewhat difficult Not difficult at all    IMMUNIZATIONS: - Tdap: Tetanus vaccination status reviewed: Td vaccination indicated and given today. - HPV: Discussed - Influenza: Refused - Pneumovax: Not applicable - Prevnar 20: Not applicable -  Shingrix (50+): Not applicable   Past medical history, surgical history, medications, allergies, family history and social history reviewed with patient today and changes made to appropriate areas of the chart.   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  This does not mean the patient has pelvic endometriosis; this endometrioma occurred specifically during cesarean section when exposed endometrial cells became implanted in subcutaneous tissue     Depression    Gall stones    GERD (gastroesophageal reflux disease)    pyloric stenosis   Headache    migraines in the past   History of gestational diabetes 02/06/2007   Metrorrhagia 11/23/2008   Qualifier: Diagnosis of   By: Sharlet MD, James         PONV (postoperative nausea and vomiting)    Premenstrual tension syndrome 10/24/2010     Past Surgical History:  Procedure Laterality Date   CESAREAN SECTION  2001, 2008   x 2   CHOLECYSTECTOMY N/A 03/25/2023   Procedure: LAPAROSCOPIC CHOLECYSTECTOMY;  Surgeon: Rubin Calamity, MD;  Location: [facility] OR;  Service: General;  Laterality: N/A;   ESOPHAGOGASTRODUODENOSCOPY N/A 12/02/2013   Procedure: ESOPHAGOGASTRODUODENOSCOPY (EGD);  Surgeon: Lamar LULLA Bunk, MD;  Location: [facility];  Service: Endoscopy;  Laterality: N/A;   LIPOMA EXCISION N/A 09/30/2013   Procedure: Excision of  Cesarean section scar lesion ;  Surgeon: Gloris DELENA Hugger, MD;  Location: [facility];  Service: Gynecology;  Laterality: N/A;   UTERINE FIBROID SURGERY     when she had her c-section    Current Outpatient Medications on File Prior to Visit  Medication Sig   Multiple Vitamin (MULTIVITAMIN) tablet Take 1 tablet by mouth daily.   sertraline  (ZOLOFT ) 50 MG tablet Take 1 tablet (50 mg total) by mouth daily.   No current facility-administered medications on file prior to visit.    No Known Allergies   Social History   Socioeconomic History   Marital status: Married    Spouse name: Not on file   Number of children: 2   Years of education: Not on file   Highest education level: 11th grade  Occupational History   Not on file  Tobacco Use   Smoking status: Never    Passive exposure: Past   Smokeless tobacco: Never  Vaping Use   Vaping status: Never Used  Substance and Sexual Activity   Alcohol use: No   Drug use: Never   Sexual activity: Yes    Birth control/protection: Condom  Other Topics Concern   Not on file  Social History Narrative   Not on file   Social Drivers of Health   Financial Resource Strain: Not on file  Food Insecurity: Food Insecurity Present (02/27/2024)   Hunger Vital Sign    Worried About Running Out of Food in the Last Year: Sometimes true    Ran Out of Food in the Last Year: Sometimes true  Transportation Needs: No Transportation Needs (02/27/2024)   PRAPARE -  Administrator, Civil Service (Medical): No    Lack of Transportation (Non-Medical): No  Physical Activity: Not on file  Stress: Not on file  Social Connections: Unknown (12/12/2021)   Received [facility]   Social Network    Social Network: Not on file  Intimate Partner Violence: Not At Risk (10/15/2022)   Humiliation, Afraid, Rape, and Kick questionnaire  Fear of Current or Ex-Partner: No    Emotionally Abused: No    Physically Abused: No    Sexually Abused: No   Social History   Tobacco Use  Smoking Status Never   Passive exposure: Past  Smokeless Tobacco Never   Social History   Substance and Sexual Activity  Alcohol Use No    Family History  Problem Relation Age of Onset   Diabetes Mother    Hypertension Mother    Diabetes Father    Diabetes Brother    Diabetes Maternal Uncle    Heart disease Paternal Grandfather      ROS: Denies fever, fatigue, unexplained weight loss/gain, chest pain, SHOB, and palpitations. Denies neurological deficits, gastrointestinal or genitourinary complaints, and skin changes.   Objective:   Today's Vitals   06/12/24 0950  BP: 126/64  Pulse: 67  SpO2: 100%  Weight: 147 lb (66.7 kg)  Height: 4' 8 (1.422 m)    GENERAL APPEARANCE: Well-appearing, in NAD. Well nourished.  SKIN: Pink, warm and dry. Turgor normal. No rash, lesion, ulceration, or ecchymoses. Hair evenly distributed.  HEENT: HEAD: Normocephalic.  EYES: PERRLA. EOMI. Lids intact w/o defect. Sclera white, Conjunctiva pink w/o exudate.  EARS: External ear w/o redness, swelling, masses or lesions. EAC clear. TM's intact, translucent w/o bulging, appropriate landmarks visualized. Appropriate acuity to conversational tones.  NOSE: Septum midline w/o deformity. Nares patent, mucosa pink and non-inflamed w/o drainage. No sinus tenderness.  THROAT: Uvula midline. Oropharynx clear. Tonsils non-inflamed w/o exudate. Oral mucosa pink and moist. No facial  asymmetry. Small vesicular lesion present to right upper lip with mild twitching to orolabial area.  NECK: Supple, Trachea midline. Full ROM w/o pain or tenderness. No lymphadenopathy. Thyroid non-tender w/o enlargement or palpable masses.  RESPIRATORY: Chest wall symmetrical w/o masses. Respirations even and non-labored. Breath sounds clear to auscultation bilaterally. No wheezes, rales, rhonchi, or crackles. CARDIAC: S1, S2 present, regular rate and rhythm. No gallops, murmurs, rubs, or clicks. PMI w/o lifts, heaves, or thrills. No carotid bruits. Capillary refill <2 seconds. Peripheral pulses 2+ bilaterally. GI: Abdomen soft w/o distention. Normoactive bowel sounds. No palpable masses or tenderness. No guarding or rebound tenderness. Liver and spleen w/o tenderness or enlargement. No CVA tenderness.  MSK: Muscle tone and strength appropriate for age, w/o atrophy or abnormal movement.  EXTREMITIES: Active ROM intact, w/o tenderness, crepitus, or contracture. No obvious joint deformities or effusions. No clubbing, edema, or cyanosis.  NEUROLOGIC: CN's II-XII intact. Motor strength symmetrical with no obvious weakness. No sensory deficits. DTR's 2+ symmetric bilaterally. Steady, even gait.  PSYCH/MENTAL STATUS: Alert, oriented x 3. Cooperative, appropriate mood and affect.     Results for orders placed or performed in visit on 06/05/24  CBC with Differential/Platelet   Collection Time: 06/05/24  9:47 AM  Result Value Ref Range   WBC 5.4 3.4 - 10.8 x10E3/uL   RBC 4.30 3.77 - 5.28 x10E6/uL   Hemoglobin 12.6 11.1 - 15.9 g/dL   Hematocrit 59.7 65.9 - 46.6 %   MCV 94 79 - 97 fL   MCH 29.3 26.6 - 33.0 pg   MCHC 31.3 (L) 31.5 - 35.7 g/dL   RDW 84.9 88.2 - 84.5 %   Platelets 328 150 - 450 x10E3/uL   Neutrophils 47 Not Estab. %   Lymphs 44 Not Estab. %   Monocytes 7 Not Estab. %   Eos 1 Not Estab. %   Basos 1 Not Estab. %   Neutrophils Absolute 2.5 1.4 - 7.0 x10E3/uL  Lymphocytes Absolute 2.4  0.7 - 3.1 x10E3/uL   Monocytes Absolute 0.4 0.1 - 0.9 x10E3/uL   EOS (ABSOLUTE) 0.1 0.0 - 0.4 x10E3/uL   Basophils Absolute 0.0 0.0 - 0.2 x10E3/uL   Immature Granulocytes 0 Not Estab. %   Immature Grans (Abs) 0.0 0.0 - 0.1 x10E3/uL  Comprehensive metabolic panel with GFR   Collection Time: 06/05/24  9:47 AM  Result Value Ref Range   Glucose 92 70 - 99 mg/dL   BUN 15 6 - 24 mg/dL   Creatinine, Ser 9.37 0.57 - 1.00 mg/dL   eGFR 886 >40 fO/fpw/8.26   BUN/Creatinine Ratio 24 (H) 9 - 23   Sodium 139 134 - 144 mmol/L   Potassium 4.5 3.5 - 5.2 mmol/L   Chloride 104 96 - 106 mmol/L   CO2 21 20 - 29 mmol/L   Calcium 9.4 8.7 - 10.2 mg/dL   Total Protein 7.2 6.0 - 8.5 g/dL   Albumin 4.5 3.9 - 4.9 g/dL   Globulin, Total 2.7 1.5 - 4.5 g/dL   Bilirubin Total 0.4 0.0 - 1.2 mg/dL   Alkaline Phosphatase 66 41 - 116 IU/L   AST 20 0 - 40 IU/L   ALT 27 0 - 32 IU/L  Lipid panel   Collection Time: 06/05/24  9:47 AM  Result Value Ref Range   Cholesterol, Total 216 (H) 100 - 199 mg/dL   Triglycerides 82 0 - 149 mg/dL   HDL 66 >60 mg/dL   VLDL Cholesterol Cal 14 5 - 40 mg/dL   LDL Chol Calc (NIH) 863 (H) 0 - 99 mg/dL   Chol/HDL Ratio 3.3 0.0 - 4.4 ratio  TSH   Collection Time: 06/05/24  9:47 AM  Result Value Ref Range   TSH 1.550 0.450 - 4.500 uIU/mL    Assessment & Plan:  1. Annual physical exam (Primary) Discussed preventative screenings, vaccines, and healthy lifestyle with patient. Discussed HPV vaccine with patient and will consider. Patient's lab work reviewed in depth with patient.   2. Immunization due - Tdap vaccine greater than or equal to 7yo IM  3. 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 Will start Valtrex 1gm BID x 10 days. Will use for recurrent infections with instructions provided. Discussed to follow up with PCP in 1-2 week if no improvement.  - valACYclovir (VALTREX) 1000 MG tablet; Initial Cold sore infection: Take 1 tablet (1,000 mg total) by  mouth 2 (two) times daily for 10 days. For recurrent infections take 2 tablets (2,000 mg total) by mouth 2 (two) times daily for 1 day.  Dispense: 32 tablet; Refill: 0  4. Elevated LDL cholesterol level Discussed good dietary changes, regular exercise and healthy diet. Pt verbalized understanding. The 10-year ASCVD risk score (Arnett DK, et al., 2019) is: 0.6%    Orders Placed This Encounter  Procedures   Tdap vaccine greater than or equal to 7yo IM    PATIENT COUNSELING:  - Encouraged a healthy well-balanced diet. Patient may adjust caloric intake to maintain or achieve ideal body weight. May reduce intake of dietary saturated fat and total fat and have adequate dietary potassium and calcium preferably from fresh fruits, vegetables, and low-fat dairy products.   - Advised to avoid cigarette smoking. - Discussed with the patient that most people either abstain from alcohol or drink within safe limits (<=14/week and <=4 drinks/occasion for males, <=7/weeks and <= 3 drinks/occasion for females) and that the risk for alcohol disorders and other health effects rises proportionally  with the number of drinks per week and how often a drinker exceeds daily limits. - Discussed cessation/primary prevention of drug use and availability of treatment for abuse.  - Discussed sexually transmitted diseases, avoidance of unintended pregnancy and contraceptive alternatives.  - Stressed the importance of regular exercise - Injury prevention: Discussed safety belts, safety helmets, smoke detector, smoking near bedding or upholstery.  - Dental health: Discussed importance of regular tooth brushing, flossing, and dental visits.   NEXT PREVENTATIVE PHYSICAL DUE IN 1 YEAR.  Return in about 1 year (around 06/12/2025) for ANNUAL PHYSICAL.  Patient to reach out to office if new, worrisome, or unresolved symptoms arise or if no improvement in patient's condition. Patient verbalized understanding and is agreeable to  treatment plan. All questions answered to patient's satisfaction.    Thersia Schuyler Stark, [place]

## 2024-07-25 ENCOUNTER — Ambulatory Visit (HOSPITAL_COMMUNITY)
Admission: EM | Admit: 2024-07-25 | Discharge: 2024-07-25 | Disposition: A | Discharge status: Home / Self Care | Payer: Self-pay | Attending: Family Medicine | Admitting: Family Medicine

## 2024-07-25 ENCOUNTER — Ambulatory Visit (HOSPITAL_COMMUNITY): Payer: Self-pay

## 2024-07-25 ENCOUNTER — Encounter (HOSPITAL_COMMUNITY): Payer: Self-pay

## 2024-07-25 DIAGNOSIS — R319 Hematuria, unspecified: Secondary | ICD-10-CM | POA: Insufficient documentation

## 2024-07-25 DIAGNOSIS — M545 Low back pain, unspecified: Secondary | ICD-10-CM | POA: Insufficient documentation

## 2024-07-25 LAB — POCT URINE DIPSTICK
Bilirubin, UA: NEGATIVE
Glucose, UA: NEGATIVE mg/dL
Ketones, POC UA: NEGATIVE mg/dL
Leukocytes, UA: NEGATIVE
Nitrite, UA: NEGATIVE
POC PROTEIN,UA: NEGATIVE
Spec Grav, UA: <=1.005 — AB
Urobilinogen, UA: 0.2 U/dL
pH, UA: 5.5

## 2024-07-25 MED ORDER — CIPROFLOXACIN HCL 500 MG PO TABS: 500.0000 mg | ORAL_TABLET | Freq: Two times a day (BID) | ORAL | 0 refills | Status: AC

## 2024-07-25 MED ORDER — KETOROLAC TROMETHAMINE 30 MG/ML IJ SOLN
30.0000 mg | Freq: Once | INTRAMUSCULAR | Status: AC
  Administered 2024-07-25: 30 mg via INTRAMUSCULAR

## 2024-07-25 MED ORDER — KETOROLAC TROMETHAMINE 30 MG/ML IJ SOLN
INTRAMUSCULAR | Status: AC
  Filled 2024-07-25: qty 1

## 2024-07-25 MED ORDER — KETOROLAC TROMETHAMINE 10 MG PO TABS: 10.0000 mg | ORAL_TABLET | Freq: Four times a day (QID) | ORAL | 0 refills | Status: AC | PRN

## 2024-07-25 NOTE — ED Provider Notes (Signed)
 " [facility]    CSN: [id number] Arrival date & time: 07/25/24  1525      History   Chief Complaint Chief Complaint  Patient presents with   Appointment   Back Pain    HPI Marie Gray is a 43 y.o. female.    Back Pain Here for bilateral low back pain has been bothering her for about 8 days.  No fever or chills and no rash.  No dysuria and no frequency. She did also have her menstrual cycle in the middle of all this but the pink-colored urine had begun before that  Ibuprofen  800 mg has helped a little bit. No vomiting but she has been nauseated.   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  This does not mean the patient has pelvic endometriosis; this endometrioma occurred specifically during cesarean section when exposed endometrial cells became implanted in subcutaneous tissue     Depression    Gall stones    GERD (gastroesophageal reflux disease)    pyloric stenosis   Headache    migraines in the past   History of gestational diabetes 02/06/2007   Metrorrhagia 11/23/2008   Qualifier: Diagnosis of   By: Sharlet MD, James         PONV (postoperative nausea and vomiting)    Premenstrual tension syndrome 10/24/2010    Patient Active Problem List   Diagnosis Date Noted   Elevated LDL cholesterol level 06/12/2024   Herpes simplex 06/12/2024   Wellness examination 06/06/2023   S/P cholecystectomy 04/26/2023   Anxiety 03/12/2023   Gastroesophageal reflux disease 03/12/2023   Migraine headache 10/19/2008    Past Surgical History:  Procedure Laterality Date   CESAREAN SECTION  2001, 2008   x 2   CHOLECYSTECTOMY N/A 03/25/2023   Procedure: LAPAROSCOPIC CHOLECYSTECTOMY;  Surgeon: Rubin Calamity, MD;  Location: [facility] OR;  Service: General;  Laterality: N/A;   ESOPHAGOGASTRODUODENOSCOPY N/A 12/02/2013   Procedure:  ESOPHAGOGASTRODUODENOSCOPY (EGD);  Surgeon: Lamar LULLA Bunk, MD;  Location: [facility];  Service: Endoscopy;  Laterality: N/A;   LIPOMA EXCISION N/A 09/30/2013   Procedure: Excision of  Cesarean section scar lesion ;  Surgeon: Gloris DELENA Hugger, MD;  Location: [facility];  Service: Gynecology;  Laterality: N/A;   UTERINE FIBROID SURGERY     when she had her c-section    OB History     Gravida  3   Para  2   Term  1   Preterm  1   AB  1   Living  2      SAB  1   IAB  0   Ectopic  0   Multiple  0   Live Births  2            Home Medications    Prior to Admission medications  Medication Sig Start Date End Date Taking? Authorizing Provider  ciprofloxacin  (CIPRO ) 500 MG tablet Take 1 tablet (500 mg total) by mouth 2 (two) times daily for 7 days. 07/25/24 08/01/24 Yes Vonna Sharlet POUR, MD  ketorolac  (TORADOL ) 10 MG tablet Take 1 tablet (10 mg total) by mouth every 6 (six) hours as needed (pain). 07/25/24  Yes Trek Kimball K, MD  Multiple Vitamin (MULTIVITAMIN) tablet Take 1 tablet by mouth daily.   Yes [provider]  sertraline  (ZOLOFT ) 50 MG tablet Take 1 tablet (  50 mg total) by mouth daily. 06/12/24  Yes Caudle, Thersia Bitters, FNP  valACYclovir  (VALTREX ) 1000 MG tablet Initial Cold sore infection: Take 1 tablet (1,000 mg total) by mouth 2 (two) times daily for 10 days. For recurrent infections take 2 tablets (2,000 mg total) by mouth 2 (two) times daily for 1 day. 06/12/24   Caudle, Thersia Bitters, FNP    Family History Family History  Problem Relation Age of Onset   Diabetes Mother    Hypertension Mother    Diabetes Father    Diabetes Brother    Diabetes Maternal Uncle    Heart disease Paternal Grandfather     Social History Social History[1]   Allergies   Patient has no known allergies.   Review of Systems Review of Systems  Musculoskeletal:  Positive for back pain.     Physical Exam Triage Vital Signs ED Triage Vitals   Encounter Vitals Group     BP 07/25/24 1559 120/60     Girls Systolic BP Percentile --      Girls Diastolic BP Percentile --      Boys Systolic BP Percentile --      Boys Diastolic BP Percentile --      Pulse Rate 07/25/24 1559 79     Resp 07/25/24 1559 17     Temp 07/25/24 1559 98.1 F (36.7 C)     Temp Source 07/25/24 1559 Oral     SpO2 07/25/24 1559 97 %     Weight --      Height 07/25/24 1559 4' 8 (1.422 m)     Head Circumference --      Peak Flow --      Pain Score 07/25/24 1557 8     Pain Loc --      Pain Education --      Exclude from Growth Chart --    No data found.  Updated Vital Signs BP 120/60 (BP Location: Left Arm)   Pulse 79   Temp 98.1 F (36.7 C) (Oral)   Resp 17   Ht 4' 8 (1.422 m)   LMP 07/18/2024 (Approximate)   SpO2 97%   BMI 32.96 kg/m   Visual Acuity Right Eye Distance:   Left Eye Distance:   Bilateral Distance:    Right Eye Near:   Left Eye Near:    Bilateral Near:     Physical Exam Vitals reviewed.  Constitutional:      General: She is not in acute distress.    Appearance: She is not ill-appearing, toxic-appearing or diaphoretic.  HENT:     Mouth/Throat:     Mouth: Mucous membranes are moist.  Eyes:     Extraocular Movements: Extraocular movements intact.     Pupils: Pupils are equal, round, and reactive to light.  Cardiovascular:     Rate and Rhythm: Normal rate and regular rhythm.     Heart sounds: No murmur heard. Pulmonary:     Effort: Pulmonary effort is normal. No respiratory distress.     Breath sounds: Normal breath sounds. No stridor.  Abdominal:     Palpations: Abdomen is soft.     Tenderness: There is no abdominal tenderness.     Comments: There is possible bilateral CVA tenderness.  No rash  Skin:    Coloration: Skin is not jaundiced or pale.  Neurological:     General: No focal deficit present.     Mental Status: She is alert and oriented to person, place, and time.  Psychiatric:  Behavior: Behavior  normal.      UC Treatments / Results  Labs (all labs ordered are listed, but only abnormal results are displayed) Labs Reviewed  POCT URINE DIPSTICK - Abnormal; Notable for the following components:      Result Value   Color, UA light yellow (*)    Spec Grav, UA <=1.005 (*)    Blood, UA trace-intact (*)    All other components within normal limits  URINE CULTURE    EKG   Radiology No results found.  Procedures Procedures (including critical care time)  Medications Ordered in UC Medications  ketorolac  (TORADOL ) 30 MG/ML injection 30 mg (30 mg Intramuscular Given 07/25/24 1648)    Initial Impression / Assessment and Plan / UC Course  I have reviewed the triage vital signs and the nursing notes.  Pertinent labs & imaging results that were available during my care of the patient were reviewed by me and considered in my medical decision making (see chart for details).      She rates her pain at the 8-10 out of 10.  Toradol  is given here.  There is a trace of red blood cells on the urinalysis.  Culture is sent to the urine I have sent in a prescription for ketorolac  tablets and Cipro  in case there is a UTI  She is given contact information for urology  She is to go to the emergency room if she does not get relief from the treatments provided or if she worsens in any way Final Clinical Impressions(s) / UC Diagnoses   Final diagnoses:  Acute bilateral low back pain without sciatica  Hematuria, unspecified type     Discharge Instructions      The urinalysis had a tiny amount of red blood cells.  This could be sign of infection in the urine or of possible renal stone.  You have been given a shot of Toradol  30 mg today.  Ketorolac  10 mg tablets--take 1 tablet every 6 hours as needed for pain.  This is the same medicine that is in the shot we just gave you  Take Cipro  500 mg--1 tablet 2 times daily for 7 days  If you worsen in any way, or if the treatments  provided do not give you any relief in the short-term, please go to the emergency room for further evaluation  Follow-up with your primary care  Drink plenty of fluids  (El anlisis de orina mostr una pequea cantidad de glbulos rojos. Esto podra ser un signo de infeccin urinaria o de posibles clculos renales.  Hoy le hemos administrado una inyeccin de Toradol  de 30 mg.  Comprimidos de Ketorolaco de 10 mg: tome un comprimido cada 6 horas segn sea necesario para el dolor. Este es el mismo medicamento que contiene la inyeccin que le acabamos de building services engineer.  Tome Ciprofloxacino de 500 mg: un comprimido dos veces al da durante 7 [place].  Si sus sntomas empeoran o si los tratamientos no le proporcionan alivio a corto plazo, acuda a la sala de emergencias para una evaluacin adicional.  Realice un seguimiento con su mdico de cabecera.  Bebe muchos lquidos.)     ED Prescriptions     Medication Sig Dispense Auth. Provider   ketorolac  (TORADOL ) 10 MG tablet Take 1 tablet (10 mg total) by mouth every 6 (six) hours as needed (pain). 20 tablet Kymberlee Viger, Sharlet POUR, MD   ciprofloxacin  (CIPRO ) 500 MG tablet Take 1 tablet (500 mg total) by mouth 2 (two) times daily  for 7 days. 14 tablet Izayiah Tibbitts K, MD      I have reviewed the PDMP during this encounter.    [1]  Social History Tobacco Use   Smoking status: Never    Passive exposure: Past   Smokeless tobacco: Never  Vaping Use   Vaping status: Never Used  Substance Use Topics   Alcohol use: No   Drug use: Never     Vonna Sharlet POUR, MD 07/25/24 2128  "

## 2024-07-25 NOTE — Discharge Instructions (Addendum)
 The urinalysis had a tiny amount of red blood cells.  This could be sign of infection in the urine or of possible renal stone.  You have been given a shot of Toradol  30 mg today.  Ketorolac  10 mg tablets--take 1 tablet every 6 hours as needed for pain.  This is the same medicine that is in the shot we just gave you  Take Cipro  500 mg--1 tablet 2 times daily for 7 days  If you worsen in any way, or if the treatments provided do not give you any relief in the short-term, please go to the emergency room for further evaluation  Follow-up with your primary care  Drink plenty of fluids  (El anlisis de orina mostr una pequea cantidad de glbulos rojos. Esto podra ser un signo de infeccin urinaria o de posibles clculos renales.  Hoy le hemos administrado una inyeccin de Toradol  de 30 mg.  Comprimidos de Ketorolaco de 10 mg: tome un comprimido cada 6 horas segn sea necesario para el dolor. Este es el mismo medicamento que contiene la inyeccin que le acabamos de building services engineer.  Tome Ciprofloxacino de 500 mg: un comprimido dos veces al da durante 7 [place].  Si sus sntomas empeoran o si los tratamientos no le proporcionan alivio a corto plazo, acuda a la sala de emergencias para una evaluacin adicional.  Realice un seguimiento con su mdico de cabecera.  Bebe muchos lquidos.)

## 2024-07-25 NOTE — ED Triage Notes (Signed)
 Back pain and light pink urine with worsening back pain onset 8 days ago.   Patient tried Ibuprofen  with mild relief.

## 2024-07-26 LAB — URINE CULTURE

## 2024-07-29 ENCOUNTER — Ambulatory Visit: Payer: Self-pay

## 2024-07-29 ENCOUNTER — Ambulatory Visit (HOSPITAL_COMMUNITY): Payer: Self-pay

## 2024-07-29 NOTE — Telephone Encounter (Signed)
 FYI Only or Action Required?: FYI only for provider: No availability, advised mobile bus or UC in meantime, needs call back with further recommendations if possible.  Patient was last seen in primary care on 06/12/2024 by Marie Thersia Bitters, FNP.  Called Nurse Triage reporting Flank Pain and Hematuria.  Symptoms began several days ago.  Interventions attempted: Prescription medications: antibx from UC and Other: UC on 12/27.  Symptoms are: gradually worsening.  Triage Disposition: See Physician Within 24 Hours  Patient/caregiver understands and will follow disposition?: Yes     Copied from CRM #[id number]. Topic: Clinical - Red Word Triage >> Jul 29, 2024 10:48 AM Marie Gray wrote: Red Word that prompted transfer to Nurse Triage:   urgent care on 07/25/24 Marie Gray  Daughter  [phone]  not no DPR  Patient gave permission to speak  with daughter She has 2 days left of antibiotic  Symptoms: -pink in urine that is turning darker pink -no pain when urinating -lower back pain  After completed, PAS will mark the charts for merge Reason for Disposition  [1] Taking antibiotic > 72 hours (3 days) for UTI AND [2] flank or lower back pain is SAME (unchanged, not better)  Answer Assessment - Initial Assessment Questions This RN recommended pt be examined in next 24 hours, no availability with offices, advised mobile bus or UC in meantime, pt and daughter plan on mobile bus, requesting call back in meantime with further recommendations.   Pt daughter Marie Gray on phone with pt in background, pt verbalized yes permission to speak with pt daughter Lower back both sides having pain More pink in her urine Just pink no blood Lower abdominal pain Bit of nausea, no vomiting No cold/clammy skin or too weak to stand No SOB or chest pain No heart racing Better for first couple days of antibx no pink urine anymore, then yesterday started seeing pink again Couple days left of antibx Still  full streams of urine 6/10 in lower back been the same since being on antibx, abdominal pain didn't worsen either  Protocols used: Urinary Tract Infection on Antibiotic Follow-up Call - [facility]

## 2024-07-30 ENCOUNTER — Encounter (HOSPITAL_BASED_OUTPATIENT_CLINIC_OR_DEPARTMENT_OTHER): Payer: Self-pay | Admitting: *Deleted

## 2024-07-30 ENCOUNTER — Emergency Department (HOSPITAL_BASED_OUTPATIENT_CLINIC_OR_DEPARTMENT_OTHER): Payer: Self-pay

## 2024-07-30 ENCOUNTER — Emergency Department (HOSPITAL_BASED_OUTPATIENT_CLINIC_OR_DEPARTMENT_OTHER)
Admission: EM | Admit: 2024-07-30 | Discharge: 2024-07-30 | Disposition: A | Discharge status: Home / Self Care | Payer: Self-pay | Attending: Emergency Medicine | Admitting: Emergency Medicine

## 2024-07-30 ENCOUNTER — Other Ambulatory Visit: Payer: Self-pay

## 2024-07-30 DIAGNOSIS — D251 Intramural leiomyoma of uterus: Secondary | ICD-10-CM | POA: Insufficient documentation

## 2024-07-30 DIAGNOSIS — R102 Pelvic and perineal pain unspecified side: Secondary | ICD-10-CM

## 2024-07-30 LAB — CBC WITH DIFFERENTIAL/PLATELET
Abs Immature Granulocytes: 0.02 K/uL (ref 0.00–0.07)
Basophils Absolute: 0 K/uL (ref 0.0–0.1)
Basophils Relative: 1 %
Eosinophils Absolute: 0.1 K/uL (ref 0.0–0.5)
Eosinophils Relative: 1 %
HCT: 36.5 % (ref 36.0–46.0)
Hemoglobin: 12.3 g/dL (ref 12.0–15.0)
Immature Granulocytes: 0 %
Lymphocytes Relative: 38 %
Lymphs Abs: 2.2 K/uL (ref 0.7–4.0)
MCH: 30.1 pg (ref 26.0–34.0)
MCHC: 33.7 g/dL (ref 30.0–36.0)
MCV: 89.5 fL (ref 80.0–100.0)
Monocytes Absolute: 0.4 K/uL (ref 0.1–1.0)
Monocytes Relative: 8 %
Neutro Abs: 3 K/uL (ref 1.7–7.7)
Neutrophils Relative %: 52 %
Platelets: 311 K/uL (ref 150–400)
RBC: 4.08 MIL/uL (ref 3.87–5.11)
RDW: 14.6 % (ref 11.5–15.5)
WBC: 5.7 K/uL (ref 4.0–10.5)
nRBC: 0 % (ref 0.0–0.2)

## 2024-07-30 LAB — COMPREHENSIVE METABOLIC PANEL WITH GFR
ALT: 19 U/L (ref 0–44)
AST: 19 U/L (ref 15–41)
Albumin: 4.5 g/dL (ref 3.5–5.0)
Alkaline Phosphatase: 66 U/L (ref 38–126)
Anion gap: 11 (ref 5–15)
BUN: 10 mg/dL (ref 6–20)
CO2: 24 mmol/L (ref 22–32)
Calcium: 9.7 mg/dL (ref 8.9–10.3)
Chloride: 103 mmol/L (ref 98–111)
Creatinine, Ser: 0.52 mg/dL (ref 0.44–1.00)
GFR, Estimated: >60 mL/min
Glucose, Bld: 95 mg/dL (ref 70–99)
Potassium: 4 mmol/L (ref 3.5–5.1)
Sodium: 138 mmol/L (ref 135–145)
Total Bilirubin: 0.5 mg/dL (ref 0.0–1.2)
Total Protein: 7.4 g/dL (ref 6.5–8.1)

## 2024-07-30 LAB — URINALYSIS, ROUTINE W REFLEX MICROSCOPIC
Bilirubin Urine: NEGATIVE
Glucose, UA: NEGATIVE mg/dL
Ketones, ur: NEGATIVE mg/dL
Leukocytes,Ua: NEGATIVE
Nitrite: NEGATIVE
Protein, ur: NEGATIVE mg/dL
Specific Gravity, Urine: 1.015 (ref 1.005–1.030)
pH: 7 (ref 5.0–8.0)

## 2024-07-30 LAB — URINALYSIS, MICROSCOPIC (REFLEX)

## 2024-07-30 LAB — PREGNANCY, URINE: Preg Test, Ur: NEGATIVE

## 2024-07-30 MED ORDER — IOHEXOL 300 MG/ML  SOLN
100.0000 mL | Freq: Once | INTRAMUSCULAR | Status: AC | PRN
  Administered 2024-07-30: 85 mL via INTRAVENOUS

## 2024-07-30 NOTE — ED Provider Notes (Signed)
 " [place] EMERGENCY DEPARTMENT AT [facility] Provider Note   CSN: [id number] Arrival date & time: 07/30/24  1027     Patient presents with: Flank Pain and Hematuria   Marie Gray is a 44 y.o. female.   Patient is a 44 year old female with a history of GERD and status postcholecystectomy who is presenting today with a 2-week history of persistent back pain and a 1 week history of abdominal pain.  She reports the symptoms started gradually and are now constant.  She has also noticed that her urine has looked light pink.  She is not having any dysuria, frequency or urgency.  Bowel movements have not changed.  Eating does not affect the pain.  Last menstrual cycle was last week.  She went to urgent care on Saturday and at that time was found to have some hematuria.  She was started on Cipro  and Toradol  which she has been taking and reports for a few days she felt a little bit better but now symptoms are the same.  She has nausea but no vomiting and no fever.  Denies history of similar.  The history is provided by the patient and a relative. The history is limited by a language barrier. A language interpreter was used.  Flank Pain  Hematuria       Prior to Admission medications  Medication Sig Start Date End Date Taking? Authorizing Provider  ciprofloxacin  (CIPRO ) 500 MG tablet Take 1 tablet (500 mg total) by mouth 2 (two) times daily for 7 days. 07/25/24 08/01/24  Vonna Sharlet POUR, MD  ketorolac  (TORADOL ) 10 MG tablet Take 1 tablet (10 mg total) by mouth every 6 (six) hours as needed (pain). 07/25/24   Banister, Pamela K, MD  Multiple Vitamin (MULTIVITAMIN) tablet Take 1 tablet by mouth daily.    [provider]  sertraline  (ZOLOFT ) 50 MG tablet Take 1 tablet (50 mg total) by mouth daily. 06/12/24   Caudle, Thersia Bitters, FNP  valACYclovir  (VALTREX ) 1000 MG tablet Initial Cold sore infection: Take 1 tablet (1,000 mg total) by mouth 2 (two) times daily for 10  days. For recurrent infections take 2 tablets (2,000 mg total) by mouth 2 (two) times daily for 1 day. 06/12/24   Knute Thersia Bitters, FNP    Allergies: Patient has no known allergies.    Review of Systems  Genitourinary:  Positive for flank pain and hematuria.    Updated Vital Signs BP 134/80   Pulse 86   Temp 98.3 F (36.8 C) (Oral)   Ht 4' 8 (1.422 m)   Wt 66 kg   LMP 07/18/2024 (Approximate)   SpO2 100%   BMI 32.62 kg/m   Physical Exam Vitals and nursing note reviewed.  Constitutional:      General: She is not in acute distress.    Appearance: She is well-developed.  HENT:     Head: Normocephalic and atraumatic.  Eyes:     Pupils: Pupils are equal, round, and reactive to light.  Cardiovascular:     Rate and Rhythm: Normal rate and regular rhythm.     Heart sounds: Normal heart sounds. No murmur heard.    No friction rub.  Pulmonary:     Effort: Pulmonary effort is normal.     Breath sounds: Normal breath sounds. No wheezing or rales.  Abdominal:     General: Bowel sounds are normal. There is no distension.     Palpations: Abdomen is soft.     Tenderness: There is abdominal  tenderness in the suprapubic area. There is guarding. There is no rebound.  Musculoskeletal:        General: No tenderness. Normal range of motion.       Back:     Comments: No edema  Skin:    General: Skin is warm and dry.     Findings: No rash.  Neurological:     Mental Status: She is alert and oriented to person, place, and time. Mental status is at baseline.     Cranial Nerves: No cranial nerve deficit.  Psychiatric:        Behavior: Behavior normal.     (all labs ordered are listed, but only abnormal results are displayed) Labs Reviewed  URINALYSIS, ROUTINE W REFLEX MICROSCOPIC - Abnormal; Notable for the following components:      Result Value   Color, Urine STRAW (*)    Hgb urine dipstick SMALL (*)    All other components within normal limits  URINALYSIS, MICROSCOPIC  (REFLEX) - Abnormal; Notable for the following components:   Bacteria, UA RARE (*)    All other components within normal limits  CBC WITH DIFFERENTIAL/PLATELET  COMPREHENSIVE METABOLIC PANEL WITH GFR  PREGNANCY, URINE    EKG: None  Radiology: CT ABDOMEN PELVIS W CONTRAST Result Date: 07/30/2024 EXAM: CT ABDOMEN AND PELVIS WITH CONTRAST 07/30/2024 11:51:27 AM TECHNIQUE: CT of the abdomen and pelvis was performed with the administration of 85 mL of iohexol (OMNIPAQUE) 300 MG/ML solution. Multiplanar reformatted images are provided for review. Automated exposure control, iterative reconstruction, and/or weight-based adjustment of the mA/kV was utilized to reduce the radiation dose to as low as reasonably achievable. COMPARISON: Ultrasound abdomen 11/15/13 CLINICAL HISTORY: Lower abdominal pain, mid back pain. FINDINGS: LOWER CHEST: No acute abnormality. LIVER: The liver is unremarkable. GALLBLADDER AND BILE DUCTS: Gallbladder is not visualized and likely surgically absent. No biliary ductal dilatation. SPLEEN: No acute abnormality. PANCREAS: No acute abnormality. ADRENAL GLANDS: No acute abnormality. KIDNEYS, URETERS AND BLADDER: No stones in the kidneys or ureters. No hydronephrosis. No perinephric or periureteral stranding. Urinary bladder is unremarkable. GI AND BOWEL: Stomach demonstrates no acute abnormality. No small or large bowel thickening or dilatation. The appendix is unremarkable. There is no bowel obstruction. PERITONEUM AND RETROPERITONEUM: No ascites. No free air. VASCULATURE: Aorta is normal in caliber. LYMPH NODES: No lymphadenopathy. REPRODUCTIVE ORGANS: Multiple intramural uterine lesion fibroids. BONES AND SOFT TISSUES: No acute osseous abnormality. No focal soft tissue abnormality. IMPRESSION: 1. Multiple intramural uterine fibroids. 2. Gallbladder is not visualized and likely surgically absent. Electronically signed by: Morgane Naveau MD 07/30/2024 12:44 PM EST RP Workstation:  [id number]     Procedures   Medications Ordered in the ED  iohexol (OMNIPAQUE) 300 MG/ML solution 100 mL (85 mLs Intravenous Contrast Given 07/30/24 1146)                                    Medical Decision Making Amount and/or Complexity of Data Reviewed External Data Reviewed: notes. Labs: ordered. Decision-making details documented in ED Course. Radiology: ordered and independent interpretation performed. Decision-making details documented in ED Course.  Risk Prescription drug management.   Pt with multiple medical problems and comorbidities and presenting today with a complaint that caries a high risk for morbidity and mortality.  Here today with the above complaints.  Concern for possible renal stone, pyelonephritis/UTI versus a pelvic cause such as mass or ovarian pathology.  Low suspicion for  STIs.  She has no upper pain to suggest pancreatitis, hepatitis.  Lower suspicion for diverticulitis or appendicitis.  Patient does not want anything for the pain at this time.  Labs and imaging are pending. I independently interpreted patient's labs and CBC, CMP are within normal limits, urine with small hemoglobin but 0 red cells, urine pregnancy is negative. I have independently visualized and interpreted pt's images today. CT without evidence of hydronephrosis or renal stones.  Radiology does report multiple intramural uterine fibroids.  Question whether this might be the cause of her symptoms.  At this time discussed all this with the patient and her daughter.  Do not feel that she needs any Cipro  at this time and told her she can discontinue this.  She was given follow-up with OB/GYN and she will continue to use Toradol  as needed.      Final diagnoses:  Pelvic pain  Fibroids, intramural    ED Discharge Orders     None          Doretha Folks, MD 07/30/24 1306  "

## 2024-07-30 NOTE — ED Triage Notes (Signed)
 Worsening lower back pain x 2 weeks with new onset of light pink hematuria.   Pt given antibiotic for UTI on 12/27, no improvement.

## 2024-07-30 NOTE — Discharge Instructions (Signed)
 The blood work looks good today.  The scan did show that you had fibroids but no signs of kidney stones or cancer.  It is okay to take the Toradol  as needed for pain but you can discontinue the antibiotic as there is no signs of infection.

## 2025-06-14 ENCOUNTER — Encounter (HOSPITAL_BASED_OUTPATIENT_CLINIC_OR_DEPARTMENT_OTHER): Payer: Self-pay | Admitting: Family Medicine
